# Patient Record
Sex: Female | Born: 1990
Health system: Southern US, Community
[De-identification: ages and names within clinical notes are randomized; demographics above are authoritative.]

## PROBLEM LIST (undated history)

## (undated) DIAGNOSIS — F329 Major depressive disorder, single episode, unspecified: Secondary | ICD-10-CM

## (undated) DIAGNOSIS — F32A Depression, unspecified: Secondary | ICD-10-CM

## (undated) DIAGNOSIS — J45909 Unspecified asthma, uncomplicated: Secondary | ICD-10-CM

---

## 2016-02-28 ENCOUNTER — Inpatient Hospital Stay (HOSPITAL_COMMUNITY)
Admission: EM | Admit: 2016-02-28 | Discharge: 2016-03-08 | DRG: 963 | Disposition: A | Discharge status: Rehabilitation Facility | Payer: Medicaid Other | Attending: General Surgery | Admitting: General Surgery

## 2016-02-28 ENCOUNTER — Emergency Department (HOSPITAL_COMMUNITY): Payer: Medicaid Other

## 2016-02-28 ENCOUNTER — Encounter (HOSPITAL_COMMUNITY): Payer: Self-pay

## 2016-02-28 DIAGNOSIS — I4892 Unspecified atrial flutter: Secondary | ICD-10-CM | POA: Diagnosis present

## 2016-02-28 DIAGNOSIS — E8770 Fluid overload, unspecified: Secondary | ICD-10-CM | POA: Diagnosis not present

## 2016-02-28 DIAGNOSIS — E872 Acidosis: Secondary | ICD-10-CM | POA: Diagnosis not present

## 2016-02-28 DIAGNOSIS — S069X9A Unspecified intracranial injury with loss of consciousness of unspecified duration, initial encounter: Secondary | ICD-10-CM | POA: Diagnosis present

## 2016-02-28 DIAGNOSIS — J1520 Pneumonia due to staphylococcus, unspecified: Secondary | ICD-10-CM | POA: Diagnosis not present

## 2016-02-28 DIAGNOSIS — S0211GA Other fracture of occiput, right side, initial encounter for closed fracture: Secondary | ICD-10-CM | POA: Diagnosis present

## 2016-02-28 DIAGNOSIS — J690 Pneumonitis due to inhalation of food and vomit: Secondary | ICD-10-CM | POA: Diagnosis present

## 2016-02-28 DIAGNOSIS — E876 Hypokalemia: Secondary | ICD-10-CM | POA: Diagnosis present

## 2016-02-28 DIAGNOSIS — S062X9A Diffuse traumatic brain injury with loss of consciousness of unspecified duration, initial encounter: Secondary | ICD-10-CM | POA: Diagnosis present

## 2016-02-28 DIAGNOSIS — S065X9A Traumatic subdural hemorrhage with loss of consciousness of unspecified duration, initial encounter: Secondary | ICD-10-CM | POA: Diagnosis present

## 2016-02-28 DIAGNOSIS — J9601 Acute respiratory failure with hypoxia: Secondary | ICD-10-CM | POA: Diagnosis present

## 2016-02-28 DIAGNOSIS — S069XAA Unspecified intracranial injury with loss of consciousness status unknown, initial encounter: Secondary | ICD-10-CM

## 2016-02-28 DIAGNOSIS — I7771 Dissection of carotid artery: Secondary | ICD-10-CM

## 2016-02-28 DIAGNOSIS — E1165 Type 2 diabetes mellitus with hyperglycemia: Secondary | ICD-10-CM | POA: Diagnosis present

## 2016-02-28 DIAGNOSIS — I483 Typical atrial flutter: Secondary | ICD-10-CM

## 2016-02-28 DIAGNOSIS — R402221 Coma scale, best verbal response, incomprehensible words, in the field [EMT or ambulance]: Secondary | ICD-10-CM | POA: Diagnosis present

## 2016-02-28 DIAGNOSIS — R402311 Coma scale, best motor response, none, in the field [EMT or ambulance]: Secondary | ICD-10-CM | POA: Diagnosis present

## 2016-02-28 DIAGNOSIS — D62 Acute posthemorrhagic anemia: Secondary | ICD-10-CM | POA: Diagnosis present

## 2016-02-28 DIAGNOSIS — R402111 Coma scale, eyes open, never, in the field [EMT or ambulance]: Secondary | ICD-10-CM | POA: Diagnosis present

## 2016-02-28 DIAGNOSIS — S27321A Contusion of lung, unilateral, initial encounter: Secondary | ICD-10-CM | POA: Diagnosis present

## 2016-02-28 DIAGNOSIS — S15102A Unspecified injury of left vertebral artery, initial encounter: Secondary | ICD-10-CM | POA: Diagnosis present

## 2016-02-28 DIAGNOSIS — S0219XA Other fracture of base of skull, initial encounter for closed fracture: Secondary | ICD-10-CM | POA: Diagnosis present

## 2016-02-28 DIAGNOSIS — J9602 Acute respiratory failure with hypercapnia: Secondary | ICD-10-CM | POA: Diagnosis present

## 2016-02-28 DIAGNOSIS — G960 Cerebrospinal fluid leak: Secondary | ICD-10-CM | POA: Diagnosis present

## 2016-02-28 DIAGNOSIS — H5702 Anisocoria: Secondary | ICD-10-CM

## 2016-02-28 DIAGNOSIS — H4902 Third [oculomotor] nerve palsy, left eye: Secondary | ICD-10-CM | POA: Diagnosis not present

## 2016-02-28 DIAGNOSIS — J189 Pneumonia, unspecified organism: Secondary | ICD-10-CM

## 2016-02-28 DIAGNOSIS — S0990XA Unspecified injury of head, initial encounter: Secondary | ICD-10-CM | POA: Diagnosis present

## 2016-02-28 DIAGNOSIS — M542 Cervicalgia: Secondary | ICD-10-CM

## 2016-02-28 DIAGNOSIS — J9690 Respiratory failure, unspecified, unspecified whether with hypoxia or hypercapnia: Secondary | ICD-10-CM

## 2016-02-28 HISTORY — DX: Depression, unspecified: F32.A

## 2016-02-28 HISTORY — DX: Unspecified asthma, uncomplicated: J45.909

## 2016-02-28 HISTORY — DX: Major depressive disorder, single episode, unspecified: F32.9

## 2016-02-28 LAB — I-STAT CHEM 8, ED
BUN: 11 mg/dL (ref 6–20)
Calcium, Ion: 1.07 mmol/L — ABNORMAL LOW (ref 1.13–1.30)
Chloride: 105 mmol/L (ref 101–111)
Creatinine, Ser: 0.8 mg/dL (ref 0.44–1.00)
Glucose, Bld: 174 mg/dL — ABNORMAL HIGH (ref 65–99)
HEMATOCRIT: 38 % (ref 36.0–46.0)
HEMOGLOBIN: 12.9 g/dL (ref 12.0–15.0)
POTASSIUM: 2.8 mmol/L — AB (ref 3.5–5.1)
SODIUM: 141 mmol/L (ref 135–145)
TCO2: 20 mmol/L (ref 0–100)

## 2016-02-28 LAB — COMPREHENSIVE METABOLIC PANEL
ALBUMIN: 4.3 g/dL (ref 3.5–5.0)
ALT: 16 U/L (ref 14–54)
AST: 26 U/L (ref 15–41)
Alkaline Phosphatase: 75 U/L (ref 38–126)
Anion gap: 9 (ref 5–15)
BUN: 10 mg/dL (ref 6–20)
CHLORIDE: 108 mmol/L (ref 101–111)
CO2: 18 mmol/L — AB (ref 22–32)
CREATININE: 0.87 mg/dL (ref 0.44–1.00)
Calcium: 8.8 mg/dL — ABNORMAL LOW (ref 8.9–10.3)
GFR calc Af Amer: >60 mL/min (ref >60)
GFR calc non Af Amer: >60 mL/min (ref >60)
GLUCOSE: 174 mg/dL — AB (ref 65–99)
POTASSIUM: 2.8 mmol/L — AB (ref 3.5–5.1)
SODIUM: 135 mmol/L (ref 135–145)
Total Bilirubin: 0.3 mg/dL (ref 0.3–1.2)
Total Protein: 6.7 g/dL (ref 6.5–8.1)

## 2016-02-28 LAB — CBC
HEMATOCRIT: 40.4 % (ref 36.0–46.0)
HEMATOCRIT: 37.8 % (ref 36.0–46.0)
Hemoglobin: 13.6 g/dL (ref 12.0–15.0)
Hemoglobin: 13.2 g/dL (ref 12.0–15.0)
MCH: 27.6 pg (ref 26.0–34.0)
MCH: 28.7 pg (ref 26.0–34.0)
MCHC: 33.7 g/dL (ref 30.0–36.0)
MCHC: 34.9 g/dL (ref 30.0–36.0)
MCV: 82.1 fL (ref 78.0–100.0)
MCV: 82.2 fL (ref 78.0–100.0)
Platelets: 391 10*3/uL (ref 150–400)
Platelets: 245 10*3/uL (ref 150–400)
RBC: 4.92 MIL/uL (ref 3.87–5.11)
RBC: 4.6 MIL/uL (ref 3.87–5.11)
RDW: 12.9 % (ref 11.5–15.5)
RDW: 13 % (ref 11.5–15.5)
WBC: 15.2 10*3/uL — AB (ref 4.0–10.5)
WBC: 16.5 10*3/uL — AB (ref 4.0–10.5)

## 2016-02-28 LAB — PREPARE FRESH FROZEN PLASMA
UNIT DIVISION: 0
Unit division: 0

## 2016-02-28 LAB — BASIC METABOLIC PANEL
Anion gap: 10 (ref 5–15)
BUN: 9 mg/dL (ref 6–20)
CHLORIDE: 106 mmol/L (ref 101–111)
CO2: 20 mmol/L — ABNORMAL LOW (ref 22–32)
Calcium: 8.6 mg/dL — ABNORMAL LOW (ref 8.9–10.3)
Creatinine, Ser: 0.77 mg/dL (ref 0.44–1.00)
GFR calc non Af Amer: >60 mL/min (ref >60)
Glucose, Bld: 120 mg/dL — ABNORMAL HIGH (ref 65–99)
POTASSIUM: 3 mmol/L — AB (ref 3.5–5.1)
SODIUM: 136 mmol/L (ref 135–145)

## 2016-02-28 LAB — ABO/RH: ABO/RH(D): O POS

## 2016-02-28 LAB — ETHANOL: Alcohol, Ethyl (B): <5 mg/dL (ref <5)

## 2016-02-28 LAB — PROTIME-INR
INR: 1.14
Prothrombin Time: 14.7 seconds (ref 11.4–15.2)

## 2016-02-28 LAB — I-STAT CG4 LACTIC ACID, ED: Lactic Acid, Venous: 4.33 mmol/L (ref 0.5–1.9)

## 2016-02-28 LAB — TRIGLYCERIDES: Triglycerides: 88 mg/dL (ref <150)

## 2016-02-28 LAB — CDS SEROLOGY

## 2016-02-28 LAB — CBG MONITORING, ED: Glucose-Capillary: 173 mg/dL — ABNORMAL HIGH (ref 65–99)

## 2016-02-28 MED ORDER — IOPAMIDOL (ISOVUE-300) INJECTION 61%
INTRAVENOUS | Status: AC
  Filled 2016-02-28: qty 100

## 2016-02-28 MED ORDER — ANTISEPTIC ORAL RINSE SOLUTION (CORINZ): 7.0000 mL | Freq: Four times a day (QID) | OROMUCOSAL | Status: DC

## 2016-02-28 MED ORDER — SODIUM CHLORIDE 0.9 % IV SOLN
25.0000 ug/h | INTRAVENOUS | Status: DC
  Administered 2016-02-28 – 2016-02-29 (×2): 200 ug/h via INTRAVENOUS
  Administered 2016-02-29: 300 ug/h via INTRAVENOUS
  Administered 2016-03-01: 200 ug/h via INTRAVENOUS
  Administered 2016-03-03: 25 ug/h via INTRAVENOUS
  Administered 2016-03-06: 50 ug/h via INTRAVENOUS
  Filled 2016-02-28 (×8): qty 50

## 2016-02-28 MED ORDER — ONDANSETRON HCL 4 MG/2ML IJ SOLN
4.0000 mg | Freq: Four times a day (QID) | INTRAMUSCULAR | Status: DC | PRN
  Administered 2016-03-06 – 2016-03-07 (×2): 4 mg via INTRAVENOUS
  Filled 2016-02-28 (×2): qty 2

## 2016-02-28 MED ORDER — DOCUSATE SODIUM 50 MG/5ML PO LIQD
100.0000 mg | Freq: Two times a day (BID) | ORAL | Status: DC | PRN
  Administered 2016-03-02: 100 mg
  Filled 2016-02-28: qty 10

## 2016-02-28 MED ORDER — ACETAMINOPHEN 325 MG PO TABS
650.0000 mg | ORAL_TABLET | ORAL | Status: DC | PRN
  Administered 2016-03-02 – 2016-03-08 (×4): 650 mg via ORAL
  Filled 2016-02-28 (×4): qty 2

## 2016-02-28 MED ORDER — CHLORHEXIDINE GLUCONATE 0.12% ORAL RINSE (MEDLINE KIT)
15.0000 mL | Freq: Two times a day (BID) | OROMUCOSAL | Status: DC
  Administered 2016-02-28: 15 mL via OROMUCOSAL

## 2016-02-28 MED ORDER — FENTANYL CITRATE (PF) 100 MCG/2ML IJ SOLN: 50.0000 ug | Freq: Once | INTRAMUSCULAR | Status: DC

## 2016-02-28 MED ORDER — SODIUM CHLORIDE 0.9 % IV SOLN
25.0000 ug/h | INTRAVENOUS | Status: DC
  Administered 2016-02-28: 50 ug/h via INTRAVENOUS
  Filled 2016-02-28: qty 50

## 2016-02-28 MED ORDER — POTASSIUM CHLORIDE 10 MEQ/100ML IV SOLN
10.0000 meq | INTRAVENOUS | Status: AC
  Administered 2016-02-28 – 2016-02-29 (×2): 10 meq via INTRAVENOUS
  Filled 2016-02-28 (×2): qty 100

## 2016-02-28 MED ORDER — KCL IN DEXTROSE-NACL 20-5-0.45 MEQ/L-%-% IV SOLN
INTRAVENOUS | Status: DC
  Administered 2016-02-28 – 2016-03-01 (×2): via INTRAVENOUS
  Administered 2016-03-03: 100 mL/h via INTRAVENOUS
  Administered 2016-03-03: 20:00:00 via INTRAVENOUS
  Administered 2016-03-04: 100 mL/h via INTRAVENOUS
  Administered 2016-03-04 – 2016-03-05 (×3): via INTRAVENOUS
  Filled 2016-02-28 (×13): qty 1000

## 2016-02-28 MED ORDER — SUCCINYLCHOLINE CHLORIDE 20 MG/ML IJ SOLN
INTRAMUSCULAR | Status: AC | PRN
  Administered 2016-02-28: 150 mg via INTRAVENOUS

## 2016-02-28 MED ORDER — FENTANYL CITRATE (PF) 100 MCG/2ML IJ SOLN
50.0000 ug | Freq: Once | INTRAMUSCULAR | Status: AC
  Administered 2016-02-28: 50 ug via INTRAVENOUS

## 2016-02-28 MED ORDER — DOCUSATE SODIUM 100 MG PO CAPS
100.0000 mg | ORAL_CAPSULE | Freq: Two times a day (BID) | ORAL | Status: DC
  Administered 2016-03-03 – 2016-03-08 (×6): 100 mg via ORAL
  Filled 2016-02-28 (×6): qty 1

## 2016-02-28 MED ORDER — FENTANYL BOLUS VIA INFUSION
50.0000 ug | INTRAVENOUS | Status: DC | PRN
  Filled 2016-02-28: qty 50

## 2016-02-28 MED ORDER — MIDAZOLAM HCL 2 MG/2ML IJ SOLN
INTRAMUSCULAR | Status: AC
  Filled 2016-02-28: qty 4

## 2016-02-28 MED ORDER — PROPOFOL 1000 MG/100ML IV EMUL
0.0000 ug/kg/min | INTRAVENOUS | Status: DC
  Administered 2016-02-28: 50 ug/kg/min via INTRAVENOUS

## 2016-02-28 MED ORDER — PROPOFOL 10 MG/ML IV BOLUS
INTRAVENOUS | Status: DC | PRN
  Administered 2016-02-28: 50 mg via INTRAVENOUS

## 2016-02-28 MED ORDER — ONDANSETRON HCL 4 MG PO TABS: 4.0000 mg | ORAL_TABLET | Freq: Four times a day (QID) | ORAL | Status: DC | PRN

## 2016-02-28 MED ORDER — PROPOFOL 1000 MG/100ML IV EMUL
0.0000 ug/kg/min | INTRAVENOUS | Status: DC
  Administered 2016-02-28 – 2016-02-29 (×5): 50 ug/kg/min via INTRAVENOUS
  Administered 2016-02-29: 40 ug/kg/min via INTRAVENOUS
  Administered 2016-03-01 (×3): 50 ug/kg/min via INTRAVENOUS
  Administered 2016-03-01: 45 ug/kg/min via INTRAVENOUS
  Administered 2016-03-02 (×2): 40 ug/kg/min via INTRAVENOUS
  Administered 2016-03-02: 45 ug/kg/min via INTRAVENOUS
  Administered 2016-03-03: 10 ug/kg/min via INTRAVENOUS
  Administered 2016-03-03: 30 ug/kg/min via INTRAVENOUS
  Administered 2016-03-03: 25 ug/kg/min via INTRAVENOUS
  Administered 2016-03-04 (×3): 30 ug/kg/min via INTRAVENOUS
  Administered 2016-03-05: 20 ug/kg/min via INTRAVENOUS
  Administered 2016-03-05: 40 ug/kg/min via INTRAVENOUS
  Administered 2016-03-05: 20 ug/kg/min via INTRAVENOUS
  Administered 2016-03-06: 30 ug/kg/min via INTRAVENOUS
  Administered 2016-03-06: 20 ug/kg/min via INTRAVENOUS
  Filled 2016-02-28 (×27): qty 100

## 2016-02-28 MED ORDER — PROPOFOL 1000 MG/100ML IV EMUL
INTRAVENOUS | Status: DC | PRN
  Administered 2016-02-28: 10 ug/kg/min via INTRAVENOUS

## 2016-02-28 MED ORDER — MORPHINE SULFATE (PF) 2 MG/ML IV SOLN
2.0000 mg | INTRAVENOUS | Status: DC | PRN
  Administered 2016-03-06 – 2016-03-07 (×4): 2 mg via INTRAVENOUS
  Filled 2016-02-28 (×4): qty 1

## 2016-02-28 MED ORDER — FENTANYL CITRATE (PF) 100 MCG/2ML IJ SOLN
INTRAMUSCULAR | Status: AC
  Filled 2016-02-28: qty 2

## 2016-02-28 MED ORDER — ETOMIDATE 2 MG/ML IV SOLN
INTRAVENOUS | Status: DC | PRN
  Administered 2016-02-28: 20 mg via INTRAVENOUS
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
## 2016-02-28 NOTE — ED Notes (Addendum)
Trauma at bedside [facility]
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
## 2016-02-29 ENCOUNTER — Inpatient Hospital Stay (HOSPITAL_COMMUNITY): Payer: Medicaid Other

## 2016-02-29 ENCOUNTER — Encounter (HOSPITAL_COMMUNITY): Payer: Self-pay | Admitting: Radiology

## 2016-02-29 LAB — TYPE AND SCREEN
ABO/RH(D): O POS
Antibody Screen: NEGATIVE
Unit division: 0
Unit division: 0

## 2016-02-29 LAB — CBC
HCT: 35.1 % — ABNORMAL LOW (ref 36.0–46.0)
HEMOGLOBIN: 12.5 g/dL (ref 12.0–15.0)
MCH: 29 pg (ref 26.0–34.0)
MCHC: 35.6 g/dL (ref 30.0–36.0)
MCV: 81.4 fL (ref 78.0–100.0)
PLATELETS: 199 10*3/uL (ref 150–400)
RBC: 4.31 MIL/uL (ref 3.87–5.11)
RDW: 13.1 % (ref 11.5–15.5)
WBC: 19 10*3/uL — AB (ref 4.0–10.5)

## 2016-02-29 LAB — BASIC METABOLIC PANEL
ANION GAP: 7 (ref 5–15)
BUN: 8 mg/dL (ref 6–20)
CALCIUM: 8.7 mg/dL — AB (ref 8.9–10.3)
CO2: 24 mmol/L (ref 22–32)
CREATININE: 0.69 mg/dL (ref 0.44–1.00)
Chloride: 106 mmol/L (ref 101–111)
Glucose, Bld: 114 mg/dL — ABNORMAL HIGH (ref 65–99)
Potassium: 3.8 mmol/L (ref 3.5–5.1)
SODIUM: 137 mmol/L (ref 135–145)

## 2016-02-29 LAB — MAGNESIUM
Magnesium: 1.7 mg/dL (ref 1.7–2.4)
Magnesium: 1.7 mg/dL (ref 1.7–2.4)

## 2016-02-29 LAB — POCT I-STAT 3, ART BLOOD GAS (G3+)
Bicarbonate: 25.9 meq/L — ABNORMAL HIGH (ref 20.0–24.0)
O2 SAT: 99 %
PCO2 ART: 46.1 mmHg — AB (ref 35.0–45.0)
PH ART: 7.359 (ref 7.350–7.450)
Patient temperature: 37.4
TCO2: 27 mmol/L (ref 0–100)
pO2, Arterial: 135 mmHg — ABNORMAL HIGH (ref 80.0–100.0)

## 2016-02-29 LAB — GLUCOSE, CAPILLARY: GLUCOSE-CAPILLARY: 141 mg/dL — AB (ref 65–99)

## 2016-02-29 LAB — BLOOD PRODUCT ORDER (VERBAL) VERIFICATION

## 2016-02-29 LAB — PHOSPHORUS
PHOSPHORUS: 3.2 mg/dL (ref 2.5–4.6)
PHOSPHORUS: 2.7 mg/dL (ref 2.5–4.6)

## 2016-02-29 MED ORDER — SODIUM CHLORIDE 0.9 % IV SOLN: INTRAVENOUS | Status: DC | PRN

## 2016-02-29 MED ORDER — ADULT MULTIVITAMIN LIQUID CH
15.0000 mL | Freq: Every day | ORAL | Status: DC
  Administered 2016-02-29 – 2016-03-05 (×6): 15 mL
  Filled 2016-02-29 (×8): qty 15

## 2016-02-29 MED ORDER — IOPAMIDOL (ISOVUE-370) INJECTION 76%
INTRAVENOUS | Status: AC
  Administered 2016-02-29: 50 mL
  Filled 2016-02-29: qty 50

## 2016-02-29 MED ORDER — SELENIUM 50 MCG PO TABS
200.0000 ug | ORAL_TABLET | Freq: Every day | ORAL | Status: AC
  Administered 2016-02-29 – 2016-03-05 (×6): 200 ug
  Filled 2016-02-29 (×7): qty 4

## 2016-02-29 MED ORDER — SODIUM CHLORIDE 0.9% FLUSH: 10.0000 mL | INTRAVENOUS | Status: DC | PRN

## 2016-02-29 MED ORDER — VITAL HIGH PROTEIN PO LIQD: 1000.0000 mL | ORAL | Status: DC

## 2016-02-29 MED ORDER — PIVOT 1.5 CAL PO LIQD
1000.0000 mL | ORAL | Status: DC
  Administered 2016-02-29 – 2016-03-04 (×5): 1000 mL

## 2016-02-29 MED ORDER — PRO-STAT SUGAR FREE PO LIQD
30.0000 mL | Freq: Two times a day (BID) | ORAL | Status: DC
  Filled 2016-02-29: qty 30

## 2016-02-29 MED ORDER — VITAMIN C 500 MG PO TABS
1000.0000 mg | ORAL_TABLET | Freq: Three times a day (TID) | ORAL | Status: AC
  Administered 2016-02-29 – 2016-03-06 (×19): 1000 mg
  Filled 2016-02-29 (×19): qty 2

## 2016-02-29 MED ORDER — CHLORHEXIDINE GLUCONATE 0.12% ORAL RINSE (MEDLINE KIT)
15.0000 mL | Freq: Two times a day (BID) | OROMUCOSAL | Status: DC
  Administered 2016-02-29 – 2016-03-06 (×13): 15 mL via OROMUCOSAL

## 2016-02-29 MED ORDER — PRO-STAT SUGAR FREE PO LIQD
60.0000 mL | Freq: Three times a day (TID) | ORAL | Status: DC
  Administered 2016-02-29 – 2016-03-05 (×15): 60 mL
  Filled 2016-02-29 (×15): qty 60

## 2016-02-29 MED ORDER — ANTISEPTIC ORAL RINSE SOLUTION (CORINZ)
7.0000 mL | OROMUCOSAL | Status: DC
  Administered 2016-02-29 – 2016-03-06 (×64): 7 mL via OROMUCOSAL

## 2016-02-29 MED ORDER — ASPIRIN 300 MG RE SUPP
300.0000 mg | Freq: Every day | RECTAL | Status: DC
  Administered 2016-02-29: 300 mg via RECTAL
  Filled 2016-02-29: qty 1

## 2016-02-29 NOTE — Progress Notes (Signed)
Two RT's attempted Aline insertion x's two in left and right radial artery with no success. RN aware. ABG drawn.
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
## 2016-02-29 NOTE — Progress Notes (Signed)
   02/29/16 0000  Clinical Encounter Type  Visited With Family  Visit Type ED;Spiritual support  Referral From Nurse  Spiritual Encounters  Spiritual Needs Prayer;Emotional  Stress Factors  Family Stress Factors Health changes;Lack of knowledge  Chaplain stayed with family for several hours, helping communications between [facility] ED and ED, procuring information briefings, and ensuring the family was provided all support possible, logistically and emotionally. Suzane Vanderweide, Chaplain
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
## 2016-02-29 NOTE — Progress Notes (Signed)
Peripherally Inserted Central Catheter/Midline Placement  The IV Nurse has discussed with the patient and/or persons authorized to consent for the patient, the purpose of this procedure and the potential benefits and risks involved with this procedure.  The benefits include less needle sticks, lab draws from the catheter and patient may be discharged home with the catheter.  Risks include, but not limited to, infection, bleeding, blood clot (thrombus formation), and puncture of an artery; nerve damage and irregular heat beat.  Alternatives to this procedure were also discussed.  PICC/Midline Placement Documentation  PICC Double Lumen 02/29/16 PICC Right Brachial 47 cm 4 cm (Active)  Indication for Insertion or Continuance of Line Limited venous access - need for IV therapy >5 days (PICC only) 02/29/2016  2:55 PM  Exposed Catheter (cm) 4 cm 02/29/2016  2:55 PM  Dressing Change Due 03/07/16 02/29/2016  2:55 PM    Consent signed by patients uncle. Vergie Living M 02/29/2016, 2:57 PM
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
## 2016-03-01 ENCOUNTER — Inpatient Hospital Stay (HOSPITAL_COMMUNITY): Payer: Medicaid Other

## 2016-03-01 LAB — CBC
HEMATOCRIT: 30.6 % — AB (ref 36.0–46.0)
HEMOGLOBIN: 10.3 g/dL — AB (ref 12.0–15.0)
MCH: 28.1 pg (ref 26.0–34.0)
MCHC: 33.7 g/dL (ref 30.0–36.0)
MCV: 83.6 fL (ref 78.0–100.0)
Platelets: 164 10*3/uL (ref 150–400)
RBC: 3.66 MIL/uL — ABNORMAL LOW (ref 3.87–5.11)
RDW: 13.3 % (ref 11.5–15.5)
WBC: 12.8 10*3/uL — AB (ref 4.0–10.5)

## 2016-03-01 LAB — GLUCOSE, CAPILLARY
GLUCOSE-CAPILLARY: 104 mg/dL — AB (ref 65–99)
GLUCOSE-CAPILLARY: 111 mg/dL — AB (ref 65–99)
GLUCOSE-CAPILLARY: 91 mg/dL (ref 65–99)
Glucose-Capillary: 121 mg/dL — ABNORMAL HIGH (ref 65–99)
Glucose-Capillary: 88 mg/dL (ref 65–99)
Glucose-Capillary: 95 mg/dL (ref 65–99)

## 2016-03-01 LAB — MAGNESIUM
MAGNESIUM: 1.7 mg/dL (ref 1.7–2.4)
Magnesium: 1.8 mg/dL (ref 1.7–2.4)
Magnesium: 1.8 mg/dL (ref 1.7–2.4)

## 2016-03-01 LAB — POCT I-STAT 3, ART BLOOD GAS (G3+)
ACID-BASE DEFICIT: 1 mmol/L (ref 0.0–2.0)
Bicarbonate: 22.3 meq/L (ref 20.0–24.0)
O2 Saturation: 100 %
PCO2 ART: 30.9 mmHg — AB (ref 35.0–45.0)
PH ART: 7.469 — AB (ref 7.350–7.450)
Patient temperature: 99.3
TCO2: 23 mmol/L (ref 0–100)
pO2, Arterial: 160 mmHg — ABNORMAL HIGH (ref 80.0–100.0)

## 2016-03-01 LAB — BASIC METABOLIC PANEL
ANION GAP: 6 (ref 5–15)
BUN: 8 mg/dL (ref 6–20)
CHLORIDE: 106 mmol/L (ref 101–111)
CO2: 24 mmol/L (ref 22–32)
Calcium: 8.5 mg/dL — ABNORMAL LOW (ref 8.9–10.3)
Creatinine, Ser: 0.62 mg/dL (ref 0.44–1.00)
GFR calc non Af Amer: >60 mL/min (ref >60)
Glucose, Bld: 128 mg/dL — ABNORMAL HIGH (ref 65–99)
POTASSIUM: 3.4 mmol/L — AB (ref 3.5–5.1)
SODIUM: 136 mmol/L (ref 135–145)

## 2016-03-01 LAB — PHOSPHORUS
PHOSPHORUS: 2.6 mg/dL (ref 2.5–4.6)
Phosphorus: 2.9 mg/dL (ref 2.5–4.6)

## 2016-03-01 MED ORDER — QUETIAPINE FUMARATE 100 MG PO TABS
50.0000 mg | ORAL_TABLET | Freq: Two times a day (BID) | ORAL | Status: DC
Start: 2016-03-01 — End: 2016-03-05
  Administered 2016-03-01 – 2016-03-04 (×7): 50 mg
  Filled 2016-03-01 (×7): qty 1

## 2016-03-01 MED ORDER — CLONAZEPAM 0.5 MG PO TABS
0.2500 mg | ORAL_TABLET | Freq: Two times a day (BID) | ORAL | Status: DC
  Administered 2016-03-01: 0.25 mg
  Filled 2016-03-01: qty 1

## 2016-03-01 MED ORDER — QUETIAPINE FUMARATE 25 MG PO TABS
25.0000 mg | ORAL_TABLET | Freq: Two times a day (BID) | ORAL | Status: DC
  Administered 2016-03-01: 25 mg
  Filled 2016-03-01 (×2): qty 1

## 2016-03-01 MED ORDER — ASPIRIN 325 MG PO TABS
ORAL_TABLET | ORAL | Status: AC
  Filled 2016-03-01: qty 1

## 2016-03-01 MED ORDER — CLONAZEPAM 0.1 MG/ML ORAL SUSPENSION: 0.2500 mg | Freq: Two times a day (BID) | ORAL | Status: DC

## 2016-03-01 MED ORDER — PANTOPRAZOLE SODIUM 40 MG PO PACK
40.0000 mg | PACK | Freq: Every day | ORAL | Status: DC
  Administered 2016-03-01 – 2016-03-05 (×5): 40 mg
  Filled 2016-03-01 (×4): qty 20

## 2016-03-01 MED ORDER — ASPIRIN 325 MG PO TABS
325.0000 mg | ORAL_TABLET | Freq: Every day | ORAL | Status: DC
  Administered 2016-03-01 – 2016-03-08 (×8): 325 mg via ORAL
  Filled 2016-03-01 (×7): qty 1

## 2016-03-01 MED ORDER — CLONAZEPAM 0.5 MG PO TABS
0.5000 mg | ORAL_TABLET | Freq: Two times a day (BID) | ORAL | Status: DC
  Administered 2016-03-01 – 2016-03-04 (×7): 0.5 mg
  Filled 2016-03-01 (×7): qty 1

## 2016-03-01 MED ORDER — POTASSIUM CHLORIDE 20 MEQ/15ML (10%) PO SOLN
20.0000 meq | Freq: Two times a day (BID) | ORAL | Status: AC
  Administered 2016-03-01 (×2): 20 meq via ORAL
  Filled 2016-03-01 (×2): qty 15
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
## 2016-03-01 NOTE — Progress Notes (Signed)
Patient ID: Theresa Henderson, female   DOB: [birth year], 25 y.o.   MRN: [id number] BP (!) 94/52   Pulse 75   Temp 98.6 F (37 C) (Core (Comment))   Resp 16   Ht 6\' 1"  (1.854 m)   Wt 94.1 kg (207 lb 7.3 oz)   SpO2 100%   BMI 27.37 kg/m  Currently sedated, following commands earlier. Moving all extremities Attempted to wean, became agitated Given klonopin for sedation.  Improving On aspirin

## 2016-03-01 NOTE — Progress Notes (Signed)
Patient ID: Theresa Henderson, female   DOB: [birth year], 25 y.o.   MRN: [id number] I met with her mother and cousin at the bedside and updated them on the plan of care. Georganna Skeans, MD, MPH, FACS Trauma: [phone] General Surgery: [phone]

## 2016-03-01 NOTE — Progress Notes (Signed)
Patient has had multiple 5-6 beat runs of vtach tonight while sedated. Called Dr Dwain Sarna, order received for stat BMET and mag. Will continue to monitor.

## 2016-03-02 ENCOUNTER — Encounter (HOSPITAL_COMMUNITY): Payer: Self-pay | Admitting: Cardiology

## 2016-03-02 ENCOUNTER — Inpatient Hospital Stay (HOSPITAL_COMMUNITY): Payer: Medicaid Other

## 2016-03-02 DIAGNOSIS — J9602 Acute respiratory failure with hypercapnia: Secondary | ICD-10-CM

## 2016-03-02 DIAGNOSIS — J9601 Acute respiratory failure with hypoxia: Secondary | ICD-10-CM

## 2016-03-02 DIAGNOSIS — I483 Typical atrial flutter: Secondary | ICD-10-CM

## 2016-03-02 LAB — GLUCOSE, CAPILLARY
GLUCOSE-CAPILLARY: 146 mg/dL — AB (ref 65–99)
GLUCOSE-CAPILLARY: 187 mg/dL — AB (ref 65–99)
Glucose-Capillary: 98 mg/dL (ref 65–99)
Glucose-Capillary: 150 mg/dL — ABNORMAL HIGH (ref 65–99)
Glucose-Capillary: 126 mg/dL — ABNORMAL HIGH (ref 65–99)
Glucose-Capillary: 124 mg/dL — ABNORMAL HIGH (ref 65–99)

## 2016-03-02 LAB — CBC
HEMATOCRIT: 33.6 % — AB (ref 36.0–46.0)
HEMOGLOBIN: 10.9 g/dL — AB (ref 12.0–15.0)
MCH: 27.6 pg (ref 26.0–34.0)
MCHC: 32.4 g/dL (ref 30.0–36.0)
MCV: 85.1 fL (ref 78.0–100.0)
PLATELETS: 163 10*3/uL (ref 150–400)
RBC: 3.95 MIL/uL (ref 3.87–5.11)
RDW: 13.5 % (ref 11.5–15.5)
WBC: 14.7 10*3/uL — AB (ref 4.0–10.5)

## 2016-03-02 LAB — BASIC METABOLIC PANEL
Anion gap: 7 (ref 5–15)
BUN: 10 mg/dL (ref 6–20)
CALCIUM: 8.4 mg/dL — AB (ref 8.9–10.3)
CO2: 23 mmol/L (ref 22–32)
CREATININE: 0.5 mg/dL (ref 0.44–1.00)
Chloride: 107 mmol/L (ref 101–111)
GFR calc Af Amer: >60 mL/min (ref >60)
GLUCOSE: 137 mg/dL — AB (ref 65–99)
Potassium: 4.2 mmol/L (ref 3.5–5.1)
Sodium: 137 mmol/L (ref 135–145)

## 2016-03-02 LAB — TRIGLYCERIDES: TRIGLYCERIDES: 72 mg/dL (ref <150)

## 2016-03-02 LAB — BLOOD GAS, ARTERIAL
Acid-base deficit: 2.5 mmol/L — ABNORMAL HIGH (ref 0.0–2.0)
Bicarbonate: 24.9 mEq/L — ABNORMAL HIGH (ref 20.0–24.0)
DRAWN BY: 275531
FIO2: 0.3
O2 SAT: 92.4 %
PCO2 ART: 69.2 mmHg — AB (ref 35.0–45.0)
PEEP: 5 cmH2O
Patient temperature: 98.6
Pressure support: 10 cmH2O
TCO2: 27 mmol/L (ref 0–100)
pH, Arterial: 7.181 — CL (ref 7.350–7.450)
pO2, Arterial: 74.6 mmHg — ABNORMAL LOW (ref 80.0–100.0)

## 2016-03-02 LAB — TROPONIN I: Troponin I: 0.05 ng/mL (ref <0.03)

## 2016-03-02 MED ORDER — METOPROLOL TARTRATE 25 MG/10 ML ORAL SUSPENSION
12.5000 mg | Freq: Four times a day (QID) | ORAL | Status: DC
  Administered 2016-03-02 – 2016-03-06 (×16): 12.5 mg via ORAL
  Filled 2016-03-02 (×16): qty 10

## 2016-03-02 MED ORDER — DEXTROSE 5 % IV SOLN
1.0000 g | Freq: Three times a day (TID) | INTRAVENOUS | Status: DC
  Administered 2016-03-02 – 2016-03-04 (×7): 1 g via INTRAVENOUS
  Filled 2016-03-02 (×9): qty 1

## 2016-03-02 MED ORDER — DEXTROSE 5 % IV SOLN
1.0000 g | Freq: Two times a day (BID) | INTRAVENOUS | Status: DC
  Filled 2016-03-02 (×2): qty 1

## 2016-03-02 MED ORDER — METOPROLOL TARTRATE 5 MG/5ML IV SOLN
10.0000 mg | Freq: Once | INTRAVENOUS | Status: AC
Start: 2016-03-02 — End: 2016-03-02
  Administered 2016-03-02: 10 mg via INTRAVENOUS

## 2016-03-02 MED ORDER — VANCOMYCIN HCL 10 G IV SOLR
1500.0000 mg | Freq: Once | INTRAVENOUS | Status: AC
  Administered 2016-03-02: 1500 mg via INTRAVENOUS
  Filled 2016-03-02: qty 1500

## 2016-03-02 MED ORDER — METOPROLOL TARTRATE 5 MG/5ML IV SOLN
INTRAVENOUS | Status: AC
  Filled 2016-03-02: qty 20

## 2016-03-02 MED ORDER — VANCOMYCIN HCL IN DEXTROSE 1-5 GM/200ML-% IV SOLN
1000.0000 mg | Freq: Three times a day (TID) | INTRAVENOUS | Status: DC
  Administered 2016-03-02 – 2016-03-04 (×6): 1000 mg via INTRAVENOUS
  Filled 2016-03-02 (×8): qty 200

## 2016-03-02 NOTE — Progress Notes (Signed)
Pt's LMP is 02/29/16. Pt is currently having her menses.

## 2016-03-02 NOTE — Progress Notes (Signed)
Patient ID: Theresa Henderson, female   DOB: 1991-01-14, 25 y.o.   MRN: [id number] I met with her mother at the bedside and updated her and answered her questions. Georganna Skeans, MD, MPH, FACS Trauma: [phone] General Surgery: [phone]
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
## 2016-03-02 NOTE — Progress Notes (Signed)
Dr. Janee Morn notified of critical ABG results and change in rhythm. EKG ordered as well as placing patient back on full support. Orders carried out. Results of EKG shown to [place]. Troponin ordered and will consult cardiology. Will continue to monitor closely.

## 2016-03-02 NOTE — Progress Notes (Signed)
Patient ID: Theresa Henderson, female   DOB: 11/21/1990, 25 y.o.   MRN: [id number] BP 107/63   Pulse 76   Temp 97.9 F (36.6 C) (Core (Comment))   Resp 16   Ht 6\' 1"  (1.854 m)   Wt 96.9 kg (213 lb 10 oz)   LMP 02/29/2016 (Exact Date)   SpO2 100%   BMI 28.18 kg/m  SedAted, has followed commands Moves her extremities Perrl, full eom Still working on respiratory function Improving.

## 2016-03-02 NOTE — Progress Notes (Signed)
Patient ID: Theresa Henderson, female   DOB: April 20, 1991, 25 y.o.   MRN: [id number] EKG with inferolateral ischemia, possible block. I consulted cardiology for possible STEMI. Troponin ordered. BP OK and endtidal CO2 better back on the vent. Violeta Gelinas, MD, MPH, FACS Trauma: [phone] General Surgery: [phone]

## 2016-03-02 NOTE — Progress Notes (Signed)
Dr. Janee Morn made aware of critical troponin of 0.05. No new orders at this time.

## 2016-03-02 NOTE — Progress Notes (Signed)
EKG CRITICAL VALUE     12 lead EKG performed.  Critical value noted.  Dr. Tresa Endo, MD notified.   Barb Merino, CCT 03/02/2016 10:56 AM

## 2016-03-02 NOTE — Progress Notes (Signed)
Dr. Janee Morn notified of ET CO2 of 69 while weaning. Order for ABG stat. Order carried out. Will continue to monitor closely.

## 2016-03-02 NOTE — Progress Notes (Signed)
Patient ID: Theresa Henderson, female   DOB: 1990/09/22, 25 y.o.   MRN: [id number] Endtidal CO2 up with weaning - confirmed resp acidosis on ABG. Place back on full support. ABG at 1130. Will check 12 lead as some ST changes on monitor. Violeta Gelinas, MD, MPH, FACS Trauma: [phone] General Surgery: [phone]
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
## 2016-03-03 ENCOUNTER — Inpatient Hospital Stay (HOSPITAL_COMMUNITY): Payer: Medicaid Other

## 2016-03-03 ENCOUNTER — Other Ambulatory Visit (HOSPITAL_COMMUNITY): Payer: Self-pay

## 2016-03-03 DIAGNOSIS — R9431 Abnormal electrocardiogram [ECG] [EKG]: Secondary | ICD-10-CM

## 2016-03-03 LAB — BASIC METABOLIC PANEL
Anion gap: 5 (ref 5–15)
BUN: 14 mg/dL (ref 6–20)
CALCIUM: 8.1 mg/dL — AB (ref 8.9–10.3)
CO2: 27 mmol/L (ref 22–32)
CREATININE: 0.52 mg/dL (ref 0.44–1.00)
Chloride: 106 mmol/L (ref 101–111)
GFR calc Af Amer: >60 mL/min (ref >60)
GLUCOSE: 146 mg/dL — AB (ref 65–99)
Potassium: 4.1 mmol/L (ref 3.5–5.1)
Sodium: 138 mmol/L (ref 135–145)

## 2016-03-03 LAB — CBC
HEMATOCRIT: 43.3 % (ref 36.0–46.0)
Hemoglobin: 14.1 g/dL (ref 12.0–15.0)
MCH: 27.8 pg (ref 26.0–34.0)
MCHC: 32.6 g/dL (ref 30.0–36.0)
MCV: 85.2 fL (ref 78.0–100.0)
PLATELETS: 130 10*3/uL — AB (ref 150–400)
RBC: 5.08 MIL/uL (ref 3.87–5.11)
RDW: 13.4 % (ref 11.5–15.5)
WBC: 7.1 10*3/uL (ref 4.0–10.5)

## 2016-03-03 LAB — BLOOD GAS, ARTERIAL
Acid-Base Excess: 1.2 mmol/L (ref 0.0–2.0)
BICARBONATE: 27.6 meq/L — AB (ref 20.0–24.0)
Drawn by: 276051
FIO2: 0.3
O2 SAT: 96.5 %
PATIENT TEMPERATURE: 98.6
PCO2 ART: 64.1 mmHg — AB (ref 35.0–45.0)
PEEP/CPAP: 5 cmH2O
PO2 ART: 90.3 mmHg (ref 80.0–100.0)
Pressure support: 10 cmH2O
TCO2: 29.6 mmol/L (ref 0–100)
pH, Arterial: 7.257 — ABNORMAL LOW (ref 7.350–7.450)

## 2016-03-03 LAB — URINE CULTURE: Culture: NO GROWTH

## 2016-03-03 LAB — GLUCOSE, CAPILLARY
GLUCOSE-CAPILLARY: 160 mg/dL — AB (ref 65–99)
GLUCOSE-CAPILLARY: 195 mg/dL — AB (ref 65–99)
Glucose-Capillary: 149 mg/dL — ABNORMAL HIGH (ref 65–99)
Glucose-Capillary: 129 mg/dL — ABNORMAL HIGH (ref 65–99)
Glucose-Capillary: 208 mg/dL — ABNORMAL HIGH (ref 65–99)

## 2016-03-03 LAB — ECHOCARDIOGRAM COMPLETE
HEIGHTINCHES: 73 in
WEIGHTICAEL: 3460.34 [oz_av]

## 2016-03-03 MED ORDER — INSULIN ASPART 100 UNIT/ML ~~LOC~~ SOLN
0.0000 [IU] | SUBCUTANEOUS | Status: DC
Start: 2016-03-03 — End: 2016-03-08
  Administered 2016-03-03: 3 [IU] via SUBCUTANEOUS
  Administered 2016-03-03 (×2): 2 [IU] via SUBCUTANEOUS
  Administered 2016-03-04 (×3): 1 [IU] via SUBCUTANEOUS
  Administered 2016-03-04: 2 [IU] via SUBCUTANEOUS
  Administered 2016-03-04 – 2016-03-05 (×3): 1 [IU] via SUBCUTANEOUS
  Administered 2016-03-05 (×2): 2 [IU] via SUBCUTANEOUS
  Administered 2016-03-05 – 2016-03-06 (×4): 1 [IU] via SUBCUTANEOUS
  Administered 2016-03-06 (×2): 2 [IU] via SUBCUTANEOUS
  Administered 2016-03-06: 1 [IU] via SUBCUTANEOUS

## 2016-03-03 NOTE — Progress Notes (Signed)
Upon reassessment pt was unresponsive and pupils were unequal. MD aware and at bedside. Orders received. Sedation decreased, will continue to monitor. Tillman Abide
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
## 2016-03-03 NOTE — Progress Notes (Signed)
Rt notified RN of critical Co2 in Blood gas. MD notified of ABG results. Tillman Abide
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
## 2016-03-03 NOTE — Progress Notes (Signed)
No acute events Intubated and sedated PERRL Intermittently following commands Moves all extremities Stable Supportive care

## 2016-03-04 ENCOUNTER — Inpatient Hospital Stay (HOSPITAL_COMMUNITY): Payer: Medicaid Other

## 2016-03-04 LAB — GLUCOSE, CAPILLARY
GLUCOSE-CAPILLARY: 129 mg/dL — AB (ref 65–99)
GLUCOSE-CAPILLARY: 126 mg/dL — AB (ref 65–99)
GLUCOSE-CAPILLARY: 128 mg/dL — AB (ref 65–99)
Glucose-Capillary: 112 mg/dL — ABNORMAL HIGH (ref 65–99)
Glucose-Capillary: 121 mg/dL — ABNORMAL HIGH (ref 65–99)
Glucose-Capillary: 141 mg/dL — ABNORMAL HIGH (ref 65–99)
Glucose-Capillary: 166 mg/dL — ABNORMAL HIGH (ref 65–99)

## 2016-03-04 LAB — CBC WITH DIFFERENTIAL/PLATELET
BASOS PCT: 0 %
Basophils Absolute: 0 10*3/uL (ref 0.0–0.1)
EOS ABS: 0.1 10*3/uL (ref 0.0–0.7)
Eosinophils Relative: 1 %
HEMATOCRIT: 29.6 % — AB (ref 36.0–46.0)
Hemoglobin: 9.5 g/dL — ABNORMAL LOW (ref 12.0–15.0)
Lymphocytes Relative: 9 %
Lymphs Abs: 0.9 10*3/uL (ref 0.7–4.0)
MCH: 27.8 pg (ref 26.0–34.0)
MCHC: 32.4 g/dL (ref 30.0–36.0)
MCV: 85.8 fL (ref 78.0–100.0)
MONOS PCT: 10 %
Monocytes Absolute: 1 10*3/uL (ref 0.1–1.0)
NEUTROS PCT: 80 %
Neutro Abs: 8.2 10*3/uL — ABNORMAL HIGH (ref 1.7–7.7)
PLATELETS: 221 10*3/uL (ref 150–400)
RBC: 3.45 MIL/uL — ABNORMAL LOW (ref 3.87–5.11)
RDW: 13.4 % (ref 11.5–15.5)
WBC: 10.2 10*3/uL (ref 4.0–10.5)

## 2016-03-04 LAB — BASIC METABOLIC PANEL
Anion gap: 8 (ref 5–15)
BUN: 13 mg/dL (ref 6–20)
CALCIUM: 8.3 mg/dL — AB (ref 8.9–10.3)
CO2: 30 mmol/L (ref 22–32)
CREATININE: 0.48 mg/dL (ref 0.44–1.00)
Chloride: 102 mmol/L (ref 101–111)
GFR calc non Af Amer: >60 mL/min (ref >60)
Glucose, Bld: 154 mg/dL — ABNORMAL HIGH (ref 65–99)
Potassium: 3.6 mmol/L (ref 3.5–5.1)
Sodium: 140 mmol/L (ref 135–145)

## 2016-03-04 LAB — CULTURE, RESPIRATORY

## 2016-03-04 LAB — CULTURE, RESPIRATORY W GRAM STAIN

## 2016-03-04 LAB — VANCOMYCIN, TROUGH: Vancomycin Tr: 7 ug/mL — ABNORMAL LOW (ref 15–20)

## 2016-03-04 MED ORDER — SODIUM CHLORIDE 0.9 % IV SOLN
750.0000 mg | Freq: Two times a day (BID) | INTRAVENOUS | Status: DC
  Administered 2016-03-04 – 2016-03-05 (×3): 750 mg via INTRAVENOUS
  Filled 2016-03-04 (×4): qty 7.5

## 2016-03-04 MED ORDER — POLYETHYLENE GLYCOL 3350 17 G PO PACK
17.0000 g | PACK | Freq: Two times a day (BID) | ORAL | Status: DC | PRN
  Administered 2016-03-04: 17 g via ORAL
  Filled 2016-03-04: qty 1

## 2016-03-04 MED ORDER — CEFAZOLIN SODIUM-DEXTROSE 2-4 GM/100ML-% IV SOLN
2.0000 g | Freq: Three times a day (TID) | INTRAVENOUS | Status: DC
  Administered 2016-03-04 – 2016-03-08 (×13): 2 g via INTRAVENOUS
  Filled 2016-03-04 (×17): qty 100

## 2016-03-04 MED ORDER — VANCOMYCIN HCL 10 G IV SOLR
1500.0000 mg | Freq: Three times a day (TID) | INTRAVENOUS | Status: DC
  Filled 2016-03-04: qty 1500
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
## 2016-03-04 NOTE — Progress Notes (Signed)
Had some neurological changes yesterday but these have resolved. CT essentially stable Intubated and sedated PERRL Intermittently following Moves all extremities I suspect she may have had a seizure Start Keppra Continue supportive care

## 2016-03-05 ENCOUNTER — Inpatient Hospital Stay (HOSPITAL_COMMUNITY): Payer: Medicaid Other

## 2016-03-05 DIAGNOSIS — J9602 Acute respiratory failure with hypercapnia: Secondary | ICD-10-CM

## 2016-03-05 LAB — GLUCOSE, CAPILLARY
GLUCOSE-CAPILLARY: 191 mg/dL — AB (ref 65–99)
Glucose-Capillary: 130 mg/dL — ABNORMAL HIGH (ref 65–99)
Glucose-Capillary: 136 mg/dL — ABNORMAL HIGH (ref 65–99)
Glucose-Capillary: 175 mg/dL — ABNORMAL HIGH (ref 65–99)
Glucose-Capillary: 128 mg/dL — ABNORMAL HIGH (ref 65–99)

## 2016-03-05 LAB — CBC
HEMATOCRIT: 30.2 % — AB (ref 36.0–46.0)
HEMOGLOBIN: 9.7 g/dL — AB (ref 12.0–15.0)
MCH: 27.7 pg (ref 26.0–34.0)
MCHC: 32.1 g/dL (ref 30.0–36.0)
MCV: 86.3 fL (ref 78.0–100.0)
Platelets: 225 10*3/uL (ref 150–400)
RBC: 3.5 MIL/uL — AB (ref 3.87–5.11)
RDW: 13.3 % (ref 11.5–15.5)
WBC: 9.6 10*3/uL (ref 4.0–10.5)

## 2016-03-05 LAB — BASIC METABOLIC PANEL
Anion gap: 6 (ref 5–15)
BUN: 12 mg/dL (ref 6–20)
CHLORIDE: 106 mmol/L (ref 101–111)
CO2: 30 mmol/L (ref 22–32)
CREATININE: 0.41 mg/dL — AB (ref 0.44–1.00)
Calcium: 8.7 mg/dL — ABNORMAL LOW (ref 8.9–10.3)
GFR calc non Af Amer: >60 mL/min (ref >60)
Glucose, Bld: 138 mg/dL — ABNORMAL HIGH (ref 65–99)
POTASSIUM: 3.7 mmol/L (ref 3.5–5.1)
Sodium: 142 mmol/L (ref 135–145)

## 2016-03-05 LAB — TRIGLYCERIDES: TRIGLYCERIDES: 79 mg/dL (ref <150)

## 2016-03-05 LAB — BLOOD GAS, ARTERIAL
ACID-BASE DEFICIT: 1.3 mmol/L (ref 0.0–2.0)
ACID-BASE EXCESS: 3.8 mmol/L — AB (ref 0.0–2.0)
BICARBONATE: 24.4 meq/L — AB (ref 20.0–24.0)
BICARBONATE: 28.2 meq/L — AB (ref 20.0–24.0)
Drawn by: 275531
Drawn by: 36277
FIO2: 0.3
FIO2: 0.3
LHR: 16 {breaths}/min
LHR: 16 {breaths}/min
MECHVT: 500 mL
MECHVT: 500 mL
O2 SAT: 96 %
O2 SAT: 98.7 %
PATIENT TEMPERATURE: 98.6
PCO2 ART: 52.4 mmHg — AB (ref 35.0–45.0)
PEEP/CPAP: 5 cmH2O
PEEP: 5 cmH2O
PO2 ART: 81.6 mmHg (ref 80.0–100.0)
PO2 ART: 110 mmHg — AB (ref 80.0–100.0)
Patient temperature: 98.6
TCO2: 26 mmol/L (ref 0–100)
TCO2: 29.6 mmol/L (ref 0–100)
pCO2 arterial: 45.3 mmHg — ABNORMAL HIGH (ref 35.0–45.0)
pH, Arterial: 7.289 — ABNORMAL LOW (ref 7.350–7.450)
pH, Arterial: 7.41 (ref 7.350–7.450)

## 2016-03-05 MED ORDER — PIVOT 1.5 CAL PO LIQD
1000.0000 mL | ORAL | Status: DC
  Administered 2016-03-06: 1000 mL

## 2016-03-05 MED ORDER — PRO-STAT SUGAR FREE PO LIQD
60.0000 mL | Freq: Two times a day (BID) | ORAL | Status: DC
  Administered 2016-03-05: 60 mL
  Filled 2016-03-05: qty 60

## 2016-03-05 MED ORDER — CHLORHEXIDINE GLUCONATE 0.12 % MT SOLN
OROMUCOSAL | Status: AC
  Filled 2016-03-05: qty 15

## 2016-03-05 MED ORDER — QUETIAPINE FUMARATE 100 MG PO TABS
100.0000 mg | ORAL_TABLET | Freq: Two times a day (BID) | ORAL | Status: DC
Start: 2016-03-05 — End: 2016-03-07
  Administered 2016-03-05 (×2): 100 mg
  Filled 2016-03-05 (×2): qty 1

## 2016-03-05 MED ORDER — CLONAZEPAM 1 MG PO TABS
1.0000 mg | ORAL_TABLET | Freq: Two times a day (BID) | ORAL | Status: DC
  Administered 2016-03-05 – 2016-03-08 (×5): 1 mg
  Filled 2016-03-05 (×5): qty 1

## 2016-03-05 NOTE — Progress Notes (Addendum)
Patient ID: Theresa SprayHeather Henderson, female   DOB: 1990/10/08, 25 y.o.   MRN: [id number] BP 111/60   Pulse 78   Temp 100.2 F (37.9 C) (Core (Comment))   Resp 14   Ht 6\' 1"  (1.854 m)   Wt 98 kg (216 lb 0.8 oz)   LMP 02/29/2016 (Exact Date)   SpO2 100%   BMI 28.50 kg/m  Alert, opening her eyes spontaneously. Moving all extremities  following commands Certainly improving.  Continuing the weaning process.

## 2016-03-05 NOTE — Progress Notes (Signed)
Patient ID: Theresa Henderson, female   DOB: 25-Jun-1991, 25 y.o.   MRN: [id number] I met with her mother and cousin at the bedside and updated them on her status and the plan of care. Georganna Skeans, MD, MPH, FACS Trauma: [phone] General Surgery: [phone]
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
## 2016-03-06 ENCOUNTER — Inpatient Hospital Stay (HOSPITAL_COMMUNITY): Payer: Medicaid Other

## 2016-03-06 LAB — BASIC METABOLIC PANEL
ANION GAP: 6 (ref 5–15)
BUN: 11 mg/dL (ref 6–20)
CHLORIDE: 102 mmol/L (ref 101–111)
CO2: 31 mmol/L (ref 22–32)
Calcium: 8.5 mg/dL — ABNORMAL LOW (ref 8.9–10.3)
Creatinine, Ser: 0.35 mg/dL — ABNORMAL LOW (ref 0.44–1.00)
GFR calc non Af Amer: >60 mL/min (ref >60)
Glucose, Bld: 189 mg/dL — ABNORMAL HIGH (ref 65–99)
POTASSIUM: 3.7 mmol/L (ref 3.5–5.1)
Sodium: 139 mmol/L (ref 135–145)

## 2016-03-06 LAB — GLUCOSE, CAPILLARY
GLUCOSE-CAPILLARY: 165 mg/dL — AB (ref 65–99)
GLUCOSE-CAPILLARY: 132 mg/dL — AB (ref 65–99)
GLUCOSE-CAPILLARY: 153 mg/dL — AB (ref 65–99)
GLUCOSE-CAPILLARY: 134 mg/dL — AB (ref 65–99)
GLUCOSE-CAPILLARY: 120 mg/dL — AB (ref 65–99)
Glucose-Capillary: 150 mg/dL — ABNORMAL HIGH (ref 65–99)
Glucose-Capillary: 160 mg/dL — ABNORMAL HIGH (ref 65–99)

## 2016-03-06 LAB — CBC
HEMATOCRIT: 29.4 % — AB (ref 36.0–46.0)
HEMOGLOBIN: 9.5 g/dL — AB (ref 12.0–15.0)
MCH: 27.7 pg (ref 26.0–34.0)
MCHC: 32.3 g/dL (ref 30.0–36.0)
MCV: 85.7 fL (ref 78.0–100.0)
Platelets: 225 10*3/uL (ref 150–400)
RBC: 3.43 MIL/uL — ABNORMAL LOW (ref 3.87–5.11)
RDW: 13.2 % (ref 11.5–15.5)
WBC: 8.4 10*3/uL (ref 4.0–10.5)

## 2016-03-06 MED ORDER — CHLORHEXIDINE GLUCONATE 0.12 % MT SOLN
15.0000 mL | Freq: Two times a day (BID) | OROMUCOSAL | Status: DC
  Administered 2016-03-06 – 2016-03-07 (×3): 15 mL via OROMUCOSAL
  Filled 2016-03-06 (×3): qty 15

## 2016-03-06 MED ORDER — FUROSEMIDE 10 MG/ML IJ SOLN
20.0000 mg | Freq: Once | INTRAMUSCULAR | Status: AC
Start: 2016-03-06 — End: 2016-03-06
  Administered 2016-03-06: 20 mg via INTRAVENOUS
  Filled 2016-03-06: qty 2

## 2016-03-06 MED ORDER — LORAZEPAM 2 MG/ML IJ SOLN
1.0000 mg | INTRAMUSCULAR | Status: DC | PRN
  Administered 2016-03-07 – 2016-03-08 (×4): 1 mg via INTRAVENOUS
  Filled 2016-03-06 (×5): qty 1

## 2016-03-06 MED ORDER — CETYLPYRIDINIUM CHLORIDE 0.05 % MT LIQD
7.0000 mL | Freq: Two times a day (BID) | OROMUCOSAL | Status: DC
  Administered 2016-03-07 – 2016-03-08 (×3): 7 mL via OROMUCOSAL

## 2016-03-06 MED ORDER — POTASSIUM CHLORIDE IN NACL 20-0.9 MEQ/L-% IV SOLN
INTRAVENOUS | Status: DC
  Administered 2016-03-06: 10:00:00 via INTRAVENOUS
  Administered 2016-03-07: 50 mL/h via INTRAVENOUS
  Filled 2016-03-06 (×4): qty 1000

## 2016-03-06 NOTE — Progress Notes (Signed)
Patient ID: Teodoro SprayHeather Henderson, female   DOB: 03-26-1991, 26 y.o.   MRN: [id number] Got up to chair with nursing. C spine with some posterior tenderness - will re-examine in AM. May need flex ex. Therapies ordered.  Violeta GelinasBurke Takuya Lariccia, MD, MPH, FACS Trauma: [phone] General Surgery: [phone]

## 2016-03-06 NOTE — Procedures (Signed)
Extubation Procedure Note  Patient Details:   Name: Theresa Henderson DOB: 1990/12/14 MRN: [id number]   Airway Documentation:    + air leak test prior to extubation.  Prior to extubation, pt vomited.  Oral suctioned immediately.  ETT suctioned immediately- no vomit noted in ETT suction.  Per MD proceed w/ extubation.  Evaluation  O2 sats: stable throughout Complications: No apparent complications Patient did tolerate procedure well. Bilateral Breath Sounds: Clear, Other (Comment) (coarse)   Yes, pt able to speak.  No distress noted.  BBSH coarse t/o, mild right side rhonchi.  Sat 98-100% on 4 lpm [place].  No stridor noted.    Jennette KettleBrowning, Gabe Glace Joy 03/06/2016, 9:11 AM
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
## 2016-03-06 NOTE — Progress Notes (Signed)
Patient ID: Theresa SprayHeather Henderson, female   DOB: 10-Nov-1990, 25 y.o.   MRN: [id number] BP 128/68 (BP Location: Left Arm)   Pulse 70   Temp 100.2 F (37.9 C) (Core (Comment))   Resp 16   Ht 6\' 1"  (1.854 m)   Wt 98.6 kg (217 lb 6 oz)   LMP 02/29/2016 (Exact Date)   SpO2 97%   BMI 28.68 kg/m  Alert, extubated. Following commands. Still with some confusion Moving all extremities Perrl, full eom Voice is hoarse

## 2016-03-06 NOTE — Progress Notes (Signed)
Remainder of fentanyl drip wasted in sink, 240 cc. Witnessed by Ivery QualeSean Lee, RN.

## 2016-03-06 NOTE — Progress Notes (Signed)
Pt successfully extubated today.  Hopeful to start therapies with TBI team as soon as possible.  Spoke with pt's mom visiting from [place]; she states she plans to stay with pt as she recovers at the uncle's home to help care for her.  Will follow progress.    Quintella BatonJulie W. Damien Batty, RN, BSN  Trauma/Neuro ICU Case Manager [phone]

## 2016-03-07 ENCOUNTER — Inpatient Hospital Stay (HOSPITAL_COMMUNITY): Payer: Medicaid Other

## 2016-03-07 DIAGNOSIS — S069X9A Unspecified intracranial injury with loss of consciousness of unspecified duration, initial encounter: Secondary | ICD-10-CM

## 2016-03-07 LAB — CBC WITH DIFFERENTIAL/PLATELET
BASOS PCT: 1 %
Basophils Absolute: 0.1 10*3/uL (ref 0.0–0.1)
Eosinophils Absolute: 0.1 10*3/uL (ref 0.0–0.7)
Eosinophils Relative: 1 %
HEMATOCRIT: 30 % — AB (ref 36.0–46.0)
HEMOGLOBIN: 9.8 g/dL — AB (ref 12.0–15.0)
LYMPHS ABS: 1.5 10*3/uL (ref 0.7–4.0)
Lymphocytes Relative: 14 %
MCH: 27.5 pg (ref 26.0–34.0)
MCHC: 32.7 g/dL (ref 30.0–36.0)
MCV: 84 fL (ref 78.0–100.0)
MONO ABS: 1.1 10*3/uL — AB (ref 0.1–1.0)
MONOS PCT: 11 %
NEUTROS ABS: 8 10*3/uL — AB (ref 1.7–7.7)
Neutrophils Relative %: 73 %
Platelets: 313 10*3/uL (ref 150–400)
RBC: 3.57 MIL/uL — ABNORMAL LOW (ref 3.87–5.11)
RDW: 13 % (ref 11.5–15.5)
WBC: 10.8 10*3/uL — ABNORMAL HIGH (ref 4.0–10.5)

## 2016-03-07 LAB — CULTURE, BLOOD (ROUTINE X 2)
Culture: NO GROWTH
Culture: NO GROWTH

## 2016-03-07 LAB — GLUCOSE, CAPILLARY
GLUCOSE-CAPILLARY: 108 mg/dL — AB (ref 65–99)
GLUCOSE-CAPILLARY: 114 mg/dL — AB (ref 65–99)
GLUCOSE-CAPILLARY: 98 mg/dL (ref 65–99)
Glucose-Capillary: 96 mg/dL (ref 65–99)
Glucose-Capillary: 115 mg/dL — ABNORMAL HIGH (ref 65–99)
Glucose-Capillary: 104 mg/dL — ABNORMAL HIGH (ref 65–99)
Glucose-Capillary: 93 mg/dL (ref 65–99)

## 2016-03-07 LAB — BASIC METABOLIC PANEL
Anion gap: 8 (ref 5–15)
BUN: 12 mg/dL (ref 6–20)
CALCIUM: 8.7 mg/dL — AB (ref 8.9–10.3)
CHLORIDE: 103 mmol/L (ref 101–111)
CO2: 28 mmol/L (ref 22–32)
CREATININE: 0.49 mg/dL (ref 0.44–1.00)
GFR calc non Af Amer: >60 mL/min (ref >60)
GLUCOSE: 103 mg/dL — AB (ref 65–99)
Potassium: 3.5 mmol/L (ref 3.5–5.1)
Sodium: 139 mmol/L (ref 135–145)

## 2016-03-07 MED ORDER — PANTOPRAZOLE SODIUM 40 MG PO PACK
40.0000 mg | PACK | Freq: Every day | ORAL | Status: DC
  Filled 2016-03-07 (×2): qty 20

## 2016-03-07 MED ORDER — ENSURE ENLIVE PO LIQD
237.0000 mL | Freq: Two times a day (BID) | ORAL | Status: DC
  Administered 2016-03-08: 237 mL via ORAL

## 2016-03-07 MED ORDER — MORPHINE SULFATE (PF) 2 MG/ML IV SOLN
2.0000 mg | INTRAVENOUS | Status: DC | PRN
  Administered 2016-03-07 – 2016-03-08 (×4): 2 mg via INTRAVENOUS
  Filled 2016-03-07 (×5): qty 1

## 2016-03-07 MED ORDER — ADULT MULTIVITAMIN LIQUID CH
15.0000 mL | Freq: Every day | ORAL | Status: DC
  Filled 2016-03-07 (×2): qty 15

## 2016-03-07 MED ORDER — RESOURCE THICKENUP CLEAR PO POWD
ORAL | Status: DC | PRN
  Filled 2016-03-07 (×2): qty 125

## 2016-03-07 MED ORDER — METOPROLOL TARTRATE 5 MG/5ML IV SOLN: 10.0000 mg | Freq: Four times a day (QID) | INTRAVENOUS | Status: DC

## 2016-03-07 MED ORDER — FLUOXETINE HCL 20 MG PO CAPS
20.0000 mg | ORAL_CAPSULE | Freq: Every day | ORAL | Status: DC
Start: 2016-03-08 — End: 2016-03-08
  Administered 2016-03-08: 20 mg via ORAL
  Filled 2016-03-07: qty 1

## 2016-03-07 NOTE — Progress Notes (Signed)
Patient ID: Theresa SprayHeather Henderson, female   DOB: 14-Oct-1990, 25 y.o.   MRN: [id number] BP 102/60   Pulse 88   Temp 99.1 F (37.3 C) (Oral)   Resp 17   Ht 6\' 1"  (1.854 m)   Wt 96.7 kg (213 lb 3 oz)   LMP 02/29/2016 (Exact Date)   SpO2 99%   BMI 28.13 kg/m  Progressing nicely, improving neurologically Moving all extremities Perrl, full eom Symmetric facies, tongue and uvula midline Transferred today
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
## 2016-03-07 NOTE — Progress Notes (Signed)
Trauma Service Note  Subjective: Patient has been asking for the trauma surgeon all night to explain her situation.  Agitated  Objective: Vital signs in last 24 hours: Temp:  [97.9 F (36.6 C)-100.2 F (37.9 C)] 98.7 F (37.1 C) (08/09 0400) Pulse Rate:  [59-102] 78 (08/09 0700) Resp:  [0-21] 17 (08/09 0700) BP: (102-134)/(54-78) 112/70 (08/09 0700) SpO2:  [90 %-100 %] 94 % (08/09 0700) FiO2 (%):  [30 %] 30 % (08/08 0800) Weight:  [96.7 kg (213 lb 3 oz)] 96.7 kg (213 lb 3 oz) (08/09 0141) Last BM Date: 03/06/16  Intake/Output from previous day: 08/08 0701 - 08/09 0700 In: 1118.4 [I.V.:997.7; NG/GT:20.7; IV Piggyback:100] Out: 4920 [Urine:4920] Intake/Output this shift: No intake/output data recorded.  General: No apparent painful distress.  Adequate.  Lungs: Clear  Abd: Soft, good bowel sounds.  Extremities: No changes  Neuro: Intact, confused.  Moves all fours.  Lab Results: CBC   Recent Labs  03/06/16 0425 03/07/16 0330  WBC 8.4 10.8*  HGB 9.5* 9.8*  HCT 29.4* 30.0*  PLT 225 313   BMET  Recent Labs  03/06/16 0425 03/07/16 0330  NA 139 139  K 3.7 3.5  CL 102 103  CO2 31 28  GLUCOSE 189* 103*  BUN 11 12  CREATININE 0.35* 0.49  CALCIUM 8.5* 8.7*   PT/INR No results for input(s): LABPROT, INR in the last 72 hours. ABG  Recent Labs  03/05/16 0424  PHART 7.410  HCO3 28.2*    Studies/Results: Dg Chest Port 1 View  Result Date: 03/07/2016 CLINICAL DATA:  Trauma. EXAM: PORTABLE CHEST 1 VIEW COMPARISON:  03/06/2016 FINDINGS: Endotracheal and gastric decompression tubes have been removed. Right-sided PICC line tip is stable at the SVC/ RA junction. No pneumothorax. Stable bibasilar atelectasis/ infiltrates remain, right greater than left. No pneumothorax. The heart size is normal. IMPRESSION: Stable bibasilar atelectasis/ infiltrates, right greater than left. Status post extubation. Electronically Signed   By: Irish Lack M.D.   On: 03/07/2016  07:28   Dg Chest Port 1 View  Result Date: 03/06/2016 CLINICAL DATA:  Intubation. EXAM: PORTABLE CHEST 1 VIEW COMPARISON:  03/05/2016 . FINDINGS: Endotracheal tube, NG tube, right PICC line in stable position. Diffuse bilateral pulmonary infiltrates noted consistent bilateral pneumonia and/or edema, progressive from prior exam. No pleural effusion or pneumothorax. Cardiomegaly. No acute bony abnormality . IMPRESSION: 1. Lines and tubes in stable position. 2. Progressive bilateral pulmonary infiltrates consistent with bilateral pneumonia and/or pulmonary edema. 3.  Cardiomegaly. Electronically Signed   By: Maisie Fus  Register   On: 03/06/2016 07:57    Anti-infectives: Anti-infectives    Start     Dose/Rate Route Frequency Ordered Stop   03/04/16 1700  vancomycin (VANCOCIN) 1,500 mg in sodium chloride 0.9 % 500 mL IVPB  Status:  Discontinued     1,500 mg 250 mL/hr over 120 Minutes Intravenous Every 8 hours 03/04/16 1037 03/04/16 1348   03/04/16 1400  ceFAZolin (ANCEF) IVPB 2g/100 mL premix     2 g 200 mL/hr over 30 Minutes Intravenous Every 8 hours 03/04/16 1348     03/02/16 1700  vancomycin (VANCOCIN) IVPB 1000 mg/200 mL premix  Status:  Discontinued     1,000 mg 200 mL/hr over 60 Minutes Intravenous Every 8 hours 03/02/16 0824 03/04/16 1037   03/02/16 1000  ceFEPIme (MAXIPIME) 1 g in dextrose 5 % 50 mL IVPB  Status:  Discontinued     1 g 100 mL/hr over 30 Minutes Intravenous Every 12 hours 03/02/16 0806  03/02/16 0824   03/02/16 0900  ceFEPIme (MAXIPIME) 1 g in dextrose 5 % 50 mL IVPB  Status:  Discontinued     1 g 100 mL/hr over 30 Minutes Intravenous Every 8 hours 03/02/16 0824 03/04/16 1348   03/02/16 0900  vancomycin (VANCOCIN) 1,500 mg in sodium chloride 0.9 % 500 mL IVPB     1,500 mg 250 mL/hr over 120 Minutes Intravenous  Once 03/02/16 0824 03/02/16 1200      Assessment/Plan: s/Sppech therapy consultation for swallowing evaluation and cognitive evaluation.Sppech therapy consultation  for swallowing evaluation and cognitive evaluation.  Rehab consultation. Probably transfer later today.  LOS: 8 days   Marta LamasJames O. Gae BonWyatt, III, MD, FACS [phone] Trauma Surgeon 03/07/2016

## 2016-03-08 ENCOUNTER — Encounter (HOSPITAL_COMMUNITY): Payer: Self-pay | Admitting: Nurse Practitioner

## 2016-03-08 ENCOUNTER — Encounter (HOSPITAL_COMMUNITY): Payer: Self-pay

## 2016-03-08 ENCOUNTER — Inpatient Hospital Stay (HOSPITAL_COMMUNITY)
Admission: RE | Admit: 2016-03-08 | Discharge: 2016-03-23 | DRG: 949 | Disposition: A | Discharge status: Home Health | Payer: Medicaid Other | Source: Intra-hospital | Attending: Physical Medicine & Rehabilitation | Admitting: Physical Medicine & Rehabilitation

## 2016-03-08 DIAGNOSIS — S069X9S Unspecified intracranial injury with loss of consciousness of unspecified duration, sequela: Secondary | ICD-10-CM

## 2016-03-08 DIAGNOSIS — G44309 Post-traumatic headache, unspecified, not intractable: Secondary | ICD-10-CM | POA: Diagnosis present

## 2016-03-08 DIAGNOSIS — R4189 Other symptoms and signs involving cognitive functions and awareness: Secondary | ICD-10-CM | POA: Diagnosis present

## 2016-03-08 DIAGNOSIS — J690 Pneumonitis due to inhalation of food and vomit: Secondary | ICD-10-CM | POA: Diagnosis present

## 2016-03-08 DIAGNOSIS — R4587 Impulsiveness: Secondary | ICD-10-CM | POA: Diagnosis present

## 2016-03-08 DIAGNOSIS — S069X3A Unspecified intracranial injury with loss of consciousness of 1 hour to 5 hours 59 minutes, initial encounter: Secondary | ICD-10-CM | POA: Diagnosis present

## 2016-03-08 DIAGNOSIS — R519 Headache, unspecified: Secondary | ICD-10-CM

## 2016-03-08 DIAGNOSIS — R42 Dizziness and giddiness: Secondary | ICD-10-CM

## 2016-03-08 DIAGNOSIS — S06890D Other specified intracranial injury without loss of consciousness, subsequent encounter: Secondary | ICD-10-CM | POA: Diagnosis present

## 2016-03-08 DIAGNOSIS — J9601 Acute respiratory failure with hypoxia: Secondary | ICD-10-CM

## 2016-03-08 DIAGNOSIS — G4700 Insomnia, unspecified: Secondary | ICD-10-CM | POA: Diagnosis present

## 2016-03-08 DIAGNOSIS — S27329A Contusion of lung, unspecified, initial encounter: Secondary | ICD-10-CM | POA: Diagnosis present

## 2016-03-08 DIAGNOSIS — H9041 Sensorineural hearing loss, unilateral, right ear, with unrestricted hearing on the contralateral side: Secondary | ICD-10-CM | POA: Diagnosis present

## 2016-03-08 DIAGNOSIS — F09 Unspecified mental disorder due to known physiological condition: Secondary | ICD-10-CM

## 2016-03-08 DIAGNOSIS — F0281 Dementia in other diseases classified elsewhere with behavioral disturbance: Secondary | ICD-10-CM

## 2016-03-08 DIAGNOSIS — F418 Other specified anxiety disorders: Secondary | ICD-10-CM | POA: Diagnosis present

## 2016-03-08 DIAGNOSIS — J9602 Acute respiratory failure with hypercapnia: Secondary | ICD-10-CM

## 2016-03-08 DIAGNOSIS — S27329D Contusion of lung, unspecified, subsequent encounter: Secondary | ICD-10-CM | POA: Diagnosis not present

## 2016-03-08 DIAGNOSIS — R51 Headache: Secondary | ICD-10-CM

## 2016-03-08 DIAGNOSIS — S069X9A Unspecified intracranial injury with loss of consciousness of unspecified duration, initial encounter: Secondary | ICD-10-CM

## 2016-03-08 DIAGNOSIS — F02B18 Dementia in other diseases classified elsewhere, moderate, with other behavioral disturbance: Secondary | ICD-10-CM

## 2016-03-08 LAB — GLUCOSE, CAPILLARY
GLUCOSE-CAPILLARY: 110 mg/dL — AB (ref 65–99)
GLUCOSE-CAPILLARY: 119 mg/dL — AB (ref 65–99)
Glucose-Capillary: 110 mg/dL — ABNORMAL HIGH (ref 65–99)

## 2016-03-08 LAB — MRSA PCR SCREENING: MRSA by PCR: NEGATIVE

## 2016-03-08 LAB — TRIGLYCERIDES: Triglycerides: 82 mg/dL (ref <150)

## 2016-03-08 MED ORDER — QUETIAPINE FUMARATE 25 MG PO TABS
25.0000 mg | ORAL_TABLET | Freq: Three times a day (TID) | ORAL | Status: DC | PRN
  Administered 2016-03-10 – 2016-03-11 (×2): 25 mg via ORAL
  Filled 2016-03-08 (×2): qty 1

## 2016-03-08 MED ORDER — ASPIRIN 325 MG PO TABS
325.0000 mg | ORAL_TABLET | Freq: Every day | ORAL | Status: DC
  Administered 2016-03-09 – 2016-03-23 (×15): 325 mg via ORAL
  Filled 2016-03-08 (×15): qty 1

## 2016-03-08 MED ORDER — FLUOXETINE HCL 20 MG PO CAPS
20.0000 mg | ORAL_CAPSULE | Freq: Every day | ORAL | Status: DC
  Administered 2016-03-09 – 2016-03-16 (×8): 20 mg via ORAL
  Filled 2016-03-08 (×7): qty 1

## 2016-03-08 MED ORDER — TOPIRAMATE 25 MG PO TABS
50.0000 mg | ORAL_TABLET | Freq: Every day | ORAL | Status: DC
  Administered 2016-03-08 – 2016-03-11 (×4): 50 mg via ORAL
  Filled 2016-03-08 (×4): qty 2

## 2016-03-08 MED ORDER — ONDANSETRON HCL 4 MG PO TABS
4.0000 mg | ORAL_TABLET | Freq: Four times a day (QID) | ORAL | Status: DC | PRN
  Administered 2016-03-23: 4 mg via ORAL
  Filled 2016-03-08: qty 1

## 2016-03-08 MED ORDER — PANTOPRAZOLE SODIUM 40 MG PO TBEC
40.0000 mg | DELAYED_RELEASE_TABLET | Freq: Every day | ORAL | Status: DC
  Filled 2016-03-08: qty 1

## 2016-03-08 MED ORDER — CIPROFLOXACIN HCL 500 MG PO TABS
500.0000 mg | ORAL_TABLET | Freq: Two times a day (BID) | ORAL | Status: AC
  Administered 2016-03-08 – 2016-03-10 (×4): 500 mg via ORAL
  Filled 2016-03-08 (×4): qty 1

## 2016-03-08 MED ORDER — TRAZODONE HCL 50 MG PO TABS
25.0000 mg | ORAL_TABLET | Freq: Every evening | ORAL | Status: DC | PRN
  Administered 2016-03-08 – 2016-03-22 (×8): 50 mg via ORAL
  Filled 2016-03-08 (×8): qty 1

## 2016-03-08 MED ORDER — ACETAMINOPHEN 325 MG PO TABS: 650.0000 mg | ORAL_TABLET | ORAL | Status: DC | PRN

## 2016-03-08 MED ORDER — METOPROLOL TARTRATE 25 MG PO TABS
25.0000 mg | ORAL_TABLET | Freq: Two times a day (BID) | ORAL | Status: DC
  Administered 2016-03-08 – 2016-03-17 (×17): 25 mg via ORAL
  Filled 2016-03-08 (×19): qty 1

## 2016-03-08 MED ORDER — ONDANSETRON HCL 4 MG/2ML IJ SOLN: 4.0000 mg | Freq: Four times a day (QID) | INTRAMUSCULAR | Status: DC | PRN

## 2016-03-08 MED ORDER — BISACODYL 10 MG RE SUPP: 10.0000 mg | Freq: Every day | RECTAL | Status: DC | PRN

## 2016-03-08 MED ORDER — SODIUM CHLORIDE 0.9% FLUSH: 10.0000 mL | Freq: Two times a day (BID) | INTRAVENOUS | Status: DC

## 2016-03-08 MED ORDER — ENOXAPARIN SODIUM 40 MG/0.4ML ~~LOC~~ SOLN
40.0000 mg | SUBCUTANEOUS | Status: DC
  Administered 2016-03-08 – 2016-03-22 (×14): 40 mg via SUBCUTANEOUS
  Filled 2016-03-08 (×15): qty 0.4

## 2016-03-08 MED ORDER — ADULT MULTIVITAMIN W/MINERALS CH
1.0000 | ORAL_TABLET | Freq: Every day | ORAL | Status: DC
Start: 2016-03-09 — End: 2016-03-23
  Administered 2016-03-09 – 2016-03-23 (×15): 1 via ORAL
  Filled 2016-03-08 (×15): qty 1

## 2016-03-08 MED ORDER — SODIUM CHLORIDE 0.9% FLUSH
10.0000 mL | INTRAVENOUS | Status: DC | PRN
Start: 2016-03-08 — End: 2016-03-08

## 2016-03-08 MED ORDER — DOCUSATE SODIUM 100 MG PO CAPS
100.0000 mg | ORAL_CAPSULE | Freq: Two times a day (BID) | ORAL | Status: DC
  Administered 2016-03-09 – 2016-03-12 (×7): 100 mg via ORAL
  Filled 2016-03-08 (×7): qty 1

## 2016-03-08 MED ORDER — PROCHLORPERAZINE 25 MG RE SUPP: 12.5000 mg | Freq: Four times a day (QID) | RECTAL | Status: DC | PRN

## 2016-03-08 MED ORDER — CLONAZEPAM 0.5 MG PO TABS: 1.0000 mg | ORAL_TABLET | Freq: Two times a day (BID) | ORAL | Status: DC

## 2016-03-08 MED ORDER — DIPHENHYDRAMINE HCL 12.5 MG/5ML PO ELIX: 12.5000 mg | ORAL_SOLUTION | Freq: Four times a day (QID) | ORAL | Status: DC | PRN

## 2016-03-08 MED ORDER — RESOURCE THICKENUP CLEAR PO POWD
ORAL | Status: DC | PRN
  Filled 2016-03-08: qty 125

## 2016-03-08 MED ORDER — ADULT MULTIVITAMIN W/MINERALS CH
1.0000 | ORAL_TABLET | Freq: Every day | ORAL | Status: DC
Start: 2016-03-08 — End: 2016-03-08
  Filled 2016-03-08: qty 1

## 2016-03-08 MED ORDER — CLONAZEPAM 0.5 MG PO TABS
1.0000 mg | ORAL_TABLET | Freq: Three times a day (TID) | ORAL | Status: DC
  Administered 2016-03-08 – 2016-03-22 (×39): 1 mg via ORAL
  Filled 2016-03-08 (×40): qty 2

## 2016-03-08 MED ORDER — PROCHLORPERAZINE MALEATE 5 MG PO TABS: 5.0000 mg | ORAL_TABLET | Freq: Four times a day (QID) | ORAL | Status: DC | PRN

## 2016-03-08 MED ORDER — ALUM & MAG HYDROXIDE-SIMETH 200-200-20 MG/5ML PO SUSP: 30.0000 mL | ORAL | Status: DC | PRN

## 2016-03-08 MED ORDER — OXYCODONE HCL 5 MG PO TABS
5.0000 mg | ORAL_TABLET | ORAL | Status: DC | PRN
  Administered 2016-03-08 – 2016-03-23 (×31): 5 mg via ORAL
  Filled 2016-03-08 (×32): qty 1

## 2016-03-08 MED ORDER — SODIUM CHLORIDE 0.9% FLUSH: 10.0000 mL | INTRAVENOUS | Status: DC | PRN

## 2016-03-08 MED ORDER — PROCHLORPERAZINE EDISYLATE 5 MG/ML IJ SOLN: 5.0000 mg | Freq: Four times a day (QID) | INTRAMUSCULAR | Status: DC | PRN

## 2016-03-08 MED ORDER — PANTOPRAZOLE SODIUM 40 MG PO TBEC
40.0000 mg | DELAYED_RELEASE_TABLET | Freq: Every day | ORAL | Status: DC
  Administered 2016-03-09 – 2016-03-23 (×15): 40 mg via ORAL
  Filled 2016-03-08 (×15): qty 1

## 2016-03-08 MED ORDER — FLEET ENEMA 7-19 GM/118ML RE ENEM: 1.0000 | ENEMA | Freq: Once | RECTAL | Status: DC | PRN

## 2016-03-08 MED ORDER — ENSURE ENLIVE PO LIQD
237.0000 mL | Freq: Two times a day (BID) | ORAL | Status: DC
  Administered 2016-03-09 – 2016-03-14 (×11): 237 mL via ORAL

## 2016-03-08 MED ORDER — ACETAMINOPHEN 325 MG PO TABS
325.0000 mg | ORAL_TABLET | ORAL | Status: DC | PRN
  Administered 2016-03-08 – 2016-03-12 (×2): 650 mg via ORAL
  Administered 2016-03-13: 325 mg via ORAL
  Administered 2016-03-15 – 2016-03-19 (×3): 650 mg via ORAL
  Filled 2016-03-08 (×6): qty 2

## 2016-03-08 MED ORDER — GUAIFENESIN-DM 100-10 MG/5ML PO SYRP: 5.0000 mL | ORAL_SOLUTION | Freq: Four times a day (QID) | ORAL | Status: DC | PRN

## 2016-03-08 NOTE — Progress Notes (Signed)
Patient ID: Theresa SprayHeather Henderson, female   DOB: [birth year], 25 y.o.   MRN: [id number] Patient admitted to [phone] [id number] via wheelchair, escorted by nursing staff and family.  Patient and family verbalized understanding of rehab process, signed fall safety agreement.  Appears to be in no immediate distress at this time, however patient is very tearful and complaining of headache.  Called Theresa Love, PA for orders for anxiety.  New orders received and medications administered.  Patient impulsive, ambulated to bathroom without staff assistance but with family present despite family telling her not to do so.  Will continue to monitor closely.  Theresa Henderson, Jovonda Selner J, RN
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
## 2016-03-08 NOTE — Progress Notes (Signed)
Pt has been accepted for admission to [facility], and per MD is stable for discharge there today.    Quintella BatonJulie W. Tresa Jolley, RN, BSN  Trauma/Neuro ICU Case Manager [phone]

## 2016-03-09 ENCOUNTER — Inpatient Hospital Stay (HOSPITAL_COMMUNITY): Payer: Medicaid Other | Admitting: Occupational Therapy

## 2016-03-09 ENCOUNTER — Inpatient Hospital Stay (HOSPITAL_COMMUNITY): Payer: Medicaid Other

## 2016-03-09 ENCOUNTER — Inpatient Hospital Stay (HOSPITAL_COMMUNITY): Payer: Medicaid Other | Admitting: Speech Pathology

## 2016-03-09 DIAGNOSIS — S069X9D Unspecified intracranial injury with loss of consciousness of unspecified duration, subsequent encounter: Secondary | ICD-10-CM

## 2016-03-09 LAB — COMPREHENSIVE METABOLIC PANEL
ALK PHOS: 111 U/L (ref 38–126)
ALT: 37 U/L (ref 14–54)
AST: 25 U/L (ref 15–41)
Albumin: 3.3 g/dL — ABNORMAL LOW (ref 3.5–5.0)
Anion gap: 12 (ref 5–15)
BILIRUBIN TOTAL: 0.6 mg/dL (ref 0.3–1.2)
BUN: 10 mg/dL (ref 6–20)
CHLORIDE: 104 mmol/L (ref 101–111)
CO2: 23 mmol/L (ref 22–32)
CREATININE: 0.6 mg/dL (ref 0.44–1.00)
Calcium: 9.1 mg/dL (ref 8.9–10.3)
GFR calc Af Amer: >60 mL/min (ref >60)
Glucose, Bld: 98 mg/dL (ref 65–99)
Potassium: 3.9 mmol/L (ref 3.5–5.1)
Sodium: 139 mmol/L (ref 135–145)
Total Protein: 6.2 g/dL — ABNORMAL LOW (ref 6.5–8.1)

## 2016-03-09 LAB — URINALYSIS, ROUTINE W REFLEX MICROSCOPIC
BILIRUBIN URINE: NEGATIVE
Glucose, UA: NEGATIVE mg/dL
HGB URINE DIPSTICK: NEGATIVE
Ketones, ur: NEGATIVE mg/dL
Leukocytes, UA: NEGATIVE
Nitrite: NEGATIVE
Protein, ur: NEGATIVE mg/dL
SPECIFIC GRAVITY, URINE: 1.019 (ref 1.005–1.030)
pH: 7 (ref 5.0–8.0)

## 2016-03-09 LAB — CBC WITH DIFFERENTIAL/PLATELET
BASOS ABS: 0.1 10*3/uL (ref 0.0–0.1)
Basophils Relative: 1 %
Eosinophils Absolute: 0.2 10*3/uL (ref 0.0–0.7)
Eosinophils Relative: 2 %
HEMATOCRIT: 34 % — AB (ref 36.0–46.0)
HEMOGLOBIN: 11.1 g/dL — AB (ref 12.0–15.0)
LYMPHS PCT: 16 %
Lymphs Abs: 1.7 10*3/uL (ref 0.7–4.0)
MCH: 27.2 pg (ref 26.0–34.0)
MCHC: 32.6 g/dL (ref 30.0–36.0)
MCV: 83.3 fL (ref 78.0–100.0)
Monocytes Absolute: 0.7 10*3/uL (ref 0.1–1.0)
Monocytes Relative: 7 %
NEUTROS ABS: 7.6 10*3/uL (ref 1.7–7.7)
NEUTROS PCT: 74 %
PLATELETS: 342 10*3/uL (ref 150–400)
RBC: 4.08 MIL/uL (ref 3.87–5.11)
RDW: 12.9 % (ref 11.5–15.5)
WBC: 10.3 10*3/uL (ref 4.0–10.5)
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
## 2016-03-09 NOTE — Progress Notes (Signed)
Subjective/Complaints: Perseverating on going home, mother at bedside. Poor sleep last night, frequent urination as well as headache  Review systems: Denies shortness of breath, nausea, vomiting, diarrhea or constipation. Limited by cognition, poor attention and perseveration  Objective: Vital Signs: Blood pressure (!) 118/58, pulse 68, temperature 99.3 F (37.4 C), temperature source Oral, resp. rate 18, height 6' 2"  (1.88 m), weight 98.2 kg (216 lb 8 oz), last menstrual period 02/29/2016, SpO2 100 %. No results found. Results for orders placed or performed during the hospital encounter of 03/08/16 (from the past 72 hour(s))  CBC WITH DIFFERENTIAL     Status: Abnormal   Collection Time: 03/09/16  6:35 AM  Result Value Ref Range   WBC 10.3 4.0 - 10.5 K/uL   RBC 4.08 3.87 - 5.11 MIL/uL   Hemoglobin 11.1 (L) 12.0 - 15.0 g/dL   HCT 34.0 (L) 36.0 - 46.0 %   MCV 83.3 78.0 - 100.0 fL   MCH 27.2 26.0 - 34.0 pg   MCHC 32.6 30.0 - 36.0 g/dL   RDW 12.9 11.5 - 15.5 %   Platelets 342 150 - 400 K/uL   Neutrophils Relative % 74 %   Neutro Abs 7.6 1.7 - 7.7 K/uL   Lymphocytes Relative 16 %   Lymphs Abs 1.7 0.7 - 4.0 K/uL   Monocytes Relative 7 %   Monocytes Absolute 0.7 0.1 - 1.0 K/uL   Eosinophils Relative 2 %   Eosinophils Absolute 0.2 0.0 - 0.7 K/uL   Basophils Relative 1 %   Basophils Absolute 0.1 0.0 - 0.1 K/uL  Comprehensive metabolic panel     Status: Abnormal   Collection Time: 03/09/16  6:35 AM  Result Value Ref Range   Sodium 139 135 - 145 mmol/L   Potassium 3.9 3.5 - 5.1 mmol/L   Chloride 104 101 - 111 mmol/L   CO2 23 22 - 32 mmol/L   Glucose, Bld 98 65 - 99 mg/dL   BUN 10 6 - 20 mg/dL   Creatinine, Ser 0.60 0.44 - 1.00 mg/dL   Calcium 9.1 8.9 - 10.3 mg/dL   Total Protein 6.2 (L) 6.5 - 8.1 g/dL   Albumin 3.3 (L) 3.5 - 5.0 g/dL   AST 25 15 - 41 U/L   ALT 37 14 - 54 U/L   Alkaline Phosphatase 111 38 - 126 U/L   Total Bilirubin 0.6 0.3 - 1.2 mg/dL   GFR calc non Af Amer  >60 >60 mL/min   GFR calc Af Amer >60 >60 mL/min    Comment: (NOTE) The eGFR has been calculated using the CKD EPI equation. This calculation has not been validated in all clinical situations. eGFR's persistently <60 mL/min signify possible Chronic Kidney Disease.    Anion gap 12 5 - 15      General: No acute distress Mood and affect are appropriate Heart: Regular rate and rhythm no rubs murmurs or extra sounds Lungs: Clear to auscultation, breathing unlabored, no rales or wheezes Abdomen: Positive bowel sounds, soft nontender to palpation, nondistended Extremities: No clubbing, cyanosis, or edema Skin: No evidence of breakdown, no evidence of rash, ecchymosis behind the right ear Neurologic: Cranial nerves II through XII intact, motor strength is 5/5 in bilateral deltoid, bicep, tricep, grip, hip flexor, knee extensors, ankle dorsiflexor and plantar flexor Sensory exam normal sensation to light touch and proprioception in bilateral upper and lower extremities Cerebellar exam normal finger to nose to finger as well as heel to shin in bilateral upper and lower  extremities Musculoskeletal: Full range of motion in all 4 extremities. No joint swelling   Assessment/Plan: 1. 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 Physiatrist is providing close team supervision and 24 hour management of active medical problems listed below. Physiatrist and rehab team continue to assess barriers to discharge/monitor patient progress toward functional and medical goals. FIM:          Function - Air cabin crew transfer assistive device: Bedside commode, Grab bar, Walker Assist level to toilet: Moderate assist (Pt 50 - 74%/lift or lower) Assist level from toilet: Moderate assist (Pt 50 - 74%/lift or lower) Assist level to bedside  commode (at bedside): Moderate assist (Pt 50 - 74%/lift or lower) Assist level from bedside commode (at bedside): Moderate assist (Pt 50 - 74%/lift or lower)        Function - Comprehension Comprehension assist level: Follows complex conversation/direction with no assist  Function - Expression Expression: Verbal Expression assist level: Expresses complex ideas: With extra time/assistive device  Function - Social Interaction Social Interaction assist level: Interacts appropriately 90% of the time - Needs monitoring or encouragement for participation or interaction.  Function - Problem Solving Problem solving assist level: Solves basic 25 - 49% of the time - needs direction more than half the time to initiate, plan or complete simple activities  Function - Memory Memory assist level: Recognizes or recalls 25 - 49% of the time/requires cueing 50 - 75% of the time   Medical Problem List and Plan: 1. Severe cognitive deficits, decreased balance secondary to Traumatic brain injury, initiate CIR PT, OT, speech 2. DVT Prophylaxis/Anticoagulation: Mechanical: Sequential compression devices, below knee Bilateral lower extremities 3. Pain Management: oxycodone 16m Q4H prn 4. Mood: emotional lability, psychosocial support, may need SSRI vs Depakote if worsens 5. Neuropsych: This patient is not capable of making decisions on her own behalf. 6. Skin/Wound Care: monitor PICC site, no decubiti 7. Fluids/Electrolytes/Nutrition: Check CMET, may need supplementation 8.  ?Seizure taken off Keppra by NS, will monitor 9.  Pulmonary contusions as well as aspiration pneumonia. Will change IV Ancef to po Cipro 2 days 10.  Post traumatic headache start topamax qhs need to adjust dose to twice a day 11. Tachycardia with episode of A flutter was on beta blocker as recommended by cardiology Dr NMeda Coffeewill restart 12. Insomnia. Multifactorial, continue analgesics as well as Topamax for headache pain,  manage agitation from TBI, urinary frequency, check UA, however, doubt UTI given antibiotic use LOS (Days) 1 A FACE TO FACE EVALUATION WAS PERFORMED  Theresa Henderson E 03/09/2016, 9:03 AM
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
## 2016-03-09 NOTE — Progress Notes (Signed)
Speech Language Pathology Daily Session Note  Patient Details  Name: Theresa SprayHeather Henderson MRN: [id number] Date of Birth: 09-01-1990  Today's Date: 03/09/2016 SLP Individual Time: 1500-1530 SLP Individual Time Calculation (min): 30 min   Short Term Goals: Week 1: SLP Short Term Goal 1 (Week 1): Pt to answer simple y/n questions with min A with 100% acc. SLP Short Term Goal 2 (Week 1): Pt to demonstrate sustained attention to functional task with mod A. SLP Short Term Goal 3 (Week 1): Pt to demonstrate reading comprehension at the complex sentence- short paragraph level with min A.  SLP Short Term Goal 4 (Week 1): Pt to tolerate trials of thin liquids with min A for use of compensatory strategies without overt s/s aspiration.  SLP Short Term Goal 5 (Week 1): Pt demonstrate O x4 with min A.   Skilled Therapeutic Interventions:Pt seen for skilled SLP treatment targeting cognitive goals. Pt required max A to observe swallow precautions with trials of thin liquids. With one attempt to use verbal cues only, pt took large bolus and demonstrated immediate cough- tactile and verbal cueing needed to adhere to precautions. Pt Ox self, hospital and Aug 2017 this afternoon with min A to retrieve information. When asked to write her name the pt began writing all of her family members names in sentence format, "My name is .Marland Kitchen.Marland Kitchen.Marland Kitchen.My mom's name is...." Pt required max cueing for topic maintenance. Pt less tearful and more easily redirected this afternoon. No family present.     Function:  Eating Eating   Modified Consistency Diet: Yes Eating Assist Level: Set up assist for;Supervision or verbal cues;Help managing cup/glass           Cognition Comprehension Comprehension assist level: Understands basic 25 - 49% of the time/ requires cueing 50 - 75% of the time  Expression   Expression assist level: Expresses basic 25 - 49% of the time/requires cueing 50 - 75% of the time. Uses single words/gestures.  Social  Interaction Social Interaction assist level: Interacts appropriately 25 - 49% of time - Needs frequent redirection.  Problem Solving Problem solving assist level: Solves basic less than 25% of the time - needs direction nearly all the time or does not effectively solve problems and may need a restraint for safety  Memory Memory assist level: Recognizes or recalls less than 25% of the time/requires cueing greater than 75% of the time    Pain Pain Assessment Pain Assessment: No/denies pain  Therapy/Group: Individual Therapy  Rocky CraftsKara E Clarisa Danser MA, CCC-SLP 03/09/2016, 4:10 PM
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
## 2016-03-09 NOTE — Interval H&P Note (Signed)
Theresa SprayHeather Behrman was admitted today to Inpatient Rehabilitation with the diagnosis of TBI.  The patient's history has been reviewed, patient examined, and there is no change in status.  Patient continues to be appropriate for intensive inpatient rehabilitation.  I have reviewed the patient's chart and labs.  Questions were answered to the patient's satisfaction. The PAPE has been reviewed and assessment remains appropriate.  KIRSTEINS,ANDREW E 03/09/2016, 9:02 AM

## 2016-03-10 ENCOUNTER — Inpatient Hospital Stay (HOSPITAL_COMMUNITY): Payer: Self-pay

## 2016-03-10 ENCOUNTER — Inpatient Hospital Stay (HOSPITAL_COMMUNITY): Payer: Medicaid Other | Admitting: Physical Therapy

## 2016-03-10 ENCOUNTER — Inpatient Hospital Stay (HOSPITAL_COMMUNITY): Payer: Medicaid Other | Admitting: Speech Pathology

## 2016-03-10 DIAGNOSIS — S06890D Other specified intracranial injury without loss of consciousness, subsequent encounter: Principal | ICD-10-CM

## 2016-03-10 NOTE — Progress Notes (Signed)
Occupational Therapy Session Note  Patient Details  Name: Theresa SprayHeather Henderson MRN: [id number] Date of Birth: 09-07-1990  Today's Date: 03/10/2016 OT Individual Time: 1000-1100 OT Individual Time Calculation (min): 60 min   Short Term Goals: Week 1:  OT Short Term Goal 1 (Week 1): Pt will complete grooming standing at sink level with min assistance for dynamic balance. OT Short Term Goal 2 (Week 1): Pt will tolerate 30 minutes of therapeutic intervention with no emotional outbursts, as appropriate. OT Short Term Goal 3 (Week 1): Pt will don socks and shoes with supervision for increase in LB dressing independence. OT Short Term Goal 4 (Week 1): Pt will attend to 3 minutes of cognitive task with zero need for redirection.  Skilled Therapeutic Interventions/Progress Updates:   ADL-retraining at shower level with focus on sustained attention, safety awareness, endurance, dynamic standing balance, improved endurance and transfer retraining.  With min instructional cues patient completed bed mobility and ambulated to shower with steadying assist.   Pt toileted and proceeded to shower to wash her hair, shave her legs, and perform bathing.   Pt requires close supervision for safety but only intermittent redirection throughout task to sustain attention and provide clarification and reinforcement for  problem-solving.   Pt required min assist to rinse her hair, moderate assist to brush it out, and steadying assist when standing to wash her buttocks.  Pt dressed at edge of bed requesting privacy screen due to personal modesty.    Pt left in bed at end of session with bed alarm activated.  Therapy Documentation Precautions:  Precautions Precautions: Fall Precaution Comments: impulsive Restrictions Weight Bearing Restrictions: No   Pain: Pain Assessment Faces Pain Scale: No hurt   ADL: ADL Where Assessed-Upper Body Bathing:  (sitting on tub bench) ADL Comments: see functional navigator  See Function  Navigator for Current Functional Status.   Therapy/Group: Individual Therapy  Theresa Henderson 03/10/2016, 12:51 PM

## 2016-03-10 NOTE — Progress Notes (Signed)
Patient ID: Theresa Henderson, female   DOB: 1991-02-23, 25 y.o.   MRN: [id number]   03/10/16.   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  Talkative, pleasantly confused  Review systems: Denies shortness of breath, nausea, vomiting, diarrhea or constipation. Limited by cognition, poor attention and perseveration  Past Medical History:  Diagnosis Date  . Asthma   . Depression      Objective: Vital Signs: Blood pressure 125/69, pulse 92, temperature 98.8 F (37.1 C), temperature source Oral, resp. rate 18, height 6' 2"  (1.88 m), weight 201 lb (91.2 kg), last menstrual period 02/29/2016, SpO2 98 %. No results found. Results for orders placed or performed during the hospital encounter of 03/08/16 (from the past 72 hour(s))  CBC WITH DIFFERENTIAL     Status: Abnormal   Collection Time: 03/09/16  6:35 AM  Result Value Ref Range   WBC 10.3 4.0 - 10.5 K/uL   RBC 4.08 3.87 - 5.11 MIL/uL   Hemoglobin 11.1 (L) 12.0 - 15.0 g/dL   HCT 34.0 (L) 36.0 - 46.0 %   MCV 83.3 78.0 - 100.0 fL   MCH 27.2 26.0 - 34.0 pg   MCHC 32.6 30.0 - 36.0 g/dL   RDW 12.9 11.5 - 15.5 %   Platelets 342 150 - 400 K/uL   Neutrophils Relative % 74 %   Neutro Abs 7.6 1.7 - 7.7 K/uL   Lymphocytes Relative 16 %   Lymphs Abs 1.7 0.7 - 4.0 K/uL   Monocytes Relative 7 %   Monocytes Absolute 0.7 0.1 - 1.0 K/uL   Eosinophils Relative 2 %   Eosinophils Absolute 0.2 0.0 - 0.7 K/uL   Basophils Relative 1 %   Basophils Absolute 0.1 0.0 - 0.1 K/uL  Comprehensive metabolic panel     Status: Abnormal   Collection Time: 03/09/16  6:35 AM  Result Value Ref Range   Sodium 139 135 - 145 mmol/L   Potassium 3.9 3.5 - 5.1 mmol/L   Chloride 104 101 - 111 mmol/L   CO2 23 22 - 32 mmol/L   Glucose, Bld 98 65 - 99 mg/dL   BUN 10 6 - 20 mg/dL   Creatinine, Ser 0.60 0.44 -  1.00 mg/dL   Calcium 9.1 8.9 - 10.3 mg/dL   Total Protein 6.2 (L) 6.5 - 8.1 g/dL   Albumin 3.3 (L) 3.5 - 5.0 g/dL   AST 25 15 - 41 U/L   ALT 37 14 - 54 U/L   Alkaline Phosphatase 111 38 - 126 U/L   Total Bilirubin 0.6 0.3 - 1.2 mg/dL   GFR calc non Af Amer >60 >60 mL/min   GFR calc Af Amer >60 >60 mL/min    Comment: (NOTE) The eGFR has been calculated using the CKD EPI equation. This calculation has not been validated in all clinical situations. eGFR's persistently <60 mL/min signify possible Chronic Kidney Disease.    Anion gap 12 5 - 15  Urinalysis, Routine w reflex microscopic (not at [facility])     Status: Abnormal   Collection Time: 03/09/16  6:57 PM  Result Value Ref Range   Color, Urine YELLOW YELLOW   APPearance HAZY (A) CLEAR   Specific Gravity, Urine 1.019 1.005 - 1.030   pH 7.0 5.0 - 8.0   Glucose, UA NEGATIVE NEGATIVE mg/dL   Hgb urine dipstick NEGATIVE NEGATIVE   Bilirubin Urine NEGATIVE NEGATIVE  Ketones, ur NEGATIVE NEGATIVE mg/dL   Protein, ur NEGATIVE NEGATIVE mg/dL   Nitrite NEGATIVE NEGATIVE   Leukocytes, UA NEGATIVE NEGATIVE    Comment: MICROSCOPIC NOT DONE ON URINES WITH NEGATIVE PROTEIN, BLOOD, LEUKOCYTES, NITRITE, OR GLUCOSE <1000 mg/dL.      General: No acute distress Pleasantly confused Heart: Regular rate and rhythm no rubs murmurs or extra sounds Lungs: Clear to auscultation, breathing unlabored, no rales or wheezes Abdomen: Positive bowel sounds, soft nontender to palpation, nondistended Extremities: No clubbing, cyanosis, or edema Skin: No evidence of breakdown, no evidence of rash, ecchymosis behind the right ear Neurologic: Cranial nerves II through XII intact, motor strength is 5/5 in bilateral deltoid, bicep, tricep, grip, hip flexor, knee extensors, ankle dorsiflexor and plantar flexor Sensory exam normal sensation to light touch and proprioception in bilateral upper and lower extremities Cerebellar exam normal finger to nose to finger as well  as heel to shin in bilateral upper and lower extremities Musculoskeletal: Full range of motion in all 4 extremities. No joint swelling   Medical Problem List and Plan: 1. 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 DVT Prophylaxis/Anticoagulation: Mechanical: Sequential compression devices, below knee Bilateral lower extremities 3. Pain Management: oxycodone 61m Q4H prn  4.   ?Seizure taken off Keppra by NS, will monitor  LOS (Days) 2 A FACE TO FACE EVALUATION WAS PERFORMED  KNyoka Cowden8/06/2016, 10:13 AM

## 2016-03-10 NOTE — Progress Notes (Signed)
Physical Therapy Session Note  Patient Details  Name: Theresa Henderson MRN: [id number] Date of Birth: March 24, 1991  Today's Date: 03/10/2016 PT Individual Time: 1415-1445 PT Individual Time Calculation (min): 30 min    Short Term Goals: Week 1:  PT Short Term Goal 1 (Week 1): Pt will be able to gait with min assist in distracted environment PT Short Term Goal 2 (Week 1): Pt will be able to demonstrate dynamic standing balance during functional tasks with steadying assist PT Short Term Goal 3 (Week 1): Pt will be able to demonstrate sustained attention with mod assist during sessions  Skilled Therapeutic Interventions/Progress Updates:    Pt was seen bedside in the pm with family at bedside. Pt transferred supine to edge of bed, edge of bed to supine with S. Pt performed all sit to stand, stand pivot and toilet transfers with S to min guard with verbal cues. Pt ambulated around room without assistive device and S to min guard with verbal cues. Pt utilized Kinetron at 20 cm/sec for 30 seconds in standing, 30 seconds sitting and 30 seconds in standing. Pt c/o pain\soreness B knees with activity. Pt ambulated 150 feet and 125 feet without assistive device and min guard. Pt tends to perseverate requiring redirection and reinforcement that she is doing well with therapy. Pt returned to room. Pt left sitting up in bed with nurse at bedside to give pt medication for headache with call bell within reach.   Therapy Documentation Precautions:  Precautions Precautions: Fall Precaution Comments: impulsive Restrictions Weight Bearing Restrictions: No General: PT Amount of Missed Time (min): 15 Minutes PT Missed Treatment Reason: Pain Vital Signs:  Pain: Pt c/o 9/10 pain/headache, pt's nurse notified.  See Function Navigator for Current Functional Status.   Therapy/Group: Individual Therapy  Rayford HalstedMitchell, Wavie Hashimi G 03/10/2016, 3:33 PM

## 2016-03-10 NOTE — Progress Notes (Signed)
Speech Language Pathology Daily Session Note  Patient Details  Name: Theresa SprayHeather Henderson MRN: [id number] Date of Birth: Nov 08, 1990  Today's Date: 03/10/2016 SLP Individual Time: 1130-1200 SLP Individual Time Calculation (min): 30 min   Short Term Goals: Week 1: SLP Short Term Goal 1 (Week 1): Pt to answer simple y/n questions with min A with 100% acc. SLP Short Term Goal 2 (Week 1): Pt to demonstrate sustained attention to functional task with mod A. SLP Short Term Goal 3 (Week 1): Pt to demonstrate reading comprehension at the complex sentence- short paragraph level with min A.  SLP Short Term Goal 4 (Week 1): Pt to tolerate trials of thin liquids with min A for use of compensatory strategies without overt s/s aspiration.  SLP Short Term Goal 5 (Week 1): Pt demonstrate O x4 with min A.   Skilled Therapeutic Interventions: Skilled treatment session focused on cognition goals. SLP facilitated session by providing Max A verbal cues for sustained attention to functional task. Pt able to answer general/simple yes/no questions about immediate family with Mod I. Pt oriented to self, hospital, month, year and that she was in a "accident." Pt refused trials of thin liquids or ice chips this session. Pt left in bed with all needs within reach.   Function:  Eating Eating   Modified Consistency Diet: Yes Eating Assist Level: Set up assist for;Supervision or verbal cues;Help managing cup/glass   Eating Set Up Assist For: Opening containers       Cognition Comprehension Comprehension assist level: Understands basic 25 - 49% of the time/ requires cueing 50 - 75% of the time  Expression   Expression assist level: Expresses basic 25 - 49% of the time/requires cueing 50 - 75% of the time. Uses single words/gestures.  Social Interaction Social Interaction assist level: Interacts appropriately 25 - 49% of time - Needs frequent redirection.  Problem Solving Problem solving assist level: Solves basic less  than 25% of the time - needs direction nearly all the time or does not effectively solve problems and may need a restraint for safety  Memory Memory assist level: Recognizes or recalls less than 25% of the time/requires cueing greater than 75% of the time    Pain Pain Assessment Pain Assessment: 0-10 Pain Score: 7  Pain Type: Acute pain Pain Location: Head Pain Descriptors / Indicators: Headache Pain Frequency: Intermittent Pain Onset: Other (Comment) (possibly with bright light) Pain Intervention(s): Medication (See eMAR)  Therapy/Group: Individual Therapy  Theresa Henderson 03/10/2016, 3:15 PM

## 2016-03-10 NOTE — Progress Notes (Signed)
Physical Therapy Session Note  Patient Details  Name: Theresa Henderson Orsino MRN: [id number] Date of Birth: 1991/07/22  Today's Date: 03/10/2016 PT Individual Time: 0800-0900 PT Individual Time Calculation (min): 60 min    Short Term Goals: Week 1:  PT Short Term Goal 1 (Week 1): Pt will be able to gait with min assist in distracted environment PT Short Term Goal 2 (Week 1): Pt will be able to demonstrate dynamic standing balance during functional tasks with steadying assist PT Short Term Goal 3 (Week 1): Pt will be able to demonstrate sustained attention with mod assist during sessions  Skilled Therapeutic Interventions/Progress Updates: Pt was seen bedside in the am, eating breakfast. Pt transferred supine to edge of bed with head of bed elevated, side rail and S. Pt performed all sit to stand transfers and stand pivot transfers with min guard to min A. Pt performed toilet transfers with min guard to min A. Pt able to ambulate around room with min guard to min A and verbal cues. Pt ambulated 150 feet x 2 without assistive device and min guard to min A with verbal cues. Pt ambulated slalom course 50 feet x 2 without assistive device and min guard to min A. In gym treatment focused on NMR utilizing alternating cone taps 3 sets x 10 reps each. Pt rode Nu-step 5 minutes x 2 at level 4 for NMR and generalized aerobic conditioning. Pt returned to room following treatment. Pt transferred w/c to edge of bed with min guard to min A and verbal cues. Pt able to transfer from edge of bed to supine with S. Pt perseverated throughout treatment requiring redirection at times to remain focused on task at hand. Pt left sitting up in bed with call bell within reach and bed alarm on.    Therapy Documentation Precautions:  Precautions Precautions: Fall Precaution Comments: impulsive Restrictions Weight Bearing Restrictions: No General:     Pain: Pt c/o 7/10 headache, pt's nurse at bedside to medicate during  treatment.  See Function Navigator for Current Functional Status.   Therapy/Group: Individual Therapy  Rayford HalstedMitchell, Benjermin Korber G 03/10/2016, 12:11 PM

## 2016-03-11 ENCOUNTER — Inpatient Hospital Stay (HOSPITAL_COMMUNITY): Payer: Medicaid Other

## 2016-03-11 MED ORDER — BISACODYL 5 MG PO TBEC
5.0000 mg | DELAYED_RELEASE_TABLET | Freq: Every day | ORAL | Status: DC | PRN
  Administered 2016-03-11: 5 mg via ORAL
  Filled 2016-03-11: qty 1

## 2016-03-11 NOTE — Progress Notes (Signed)
Patient ID: Theresa Henderson, female   DOB: 04/19/1991, 25 y.o.   MRN: [id number]  03/11/16.   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  Talkative, pleasantly confused.  Multiple issues addressed this am   Review systems: Denies shortness of breath, nausea, vomiting, diarrhea or constipation. Limited by cognition, poor attention and perseveration  Past Medical History:  Diagnosis Date  . Asthma   . Depression      Objective: Vital Signs: Blood pressure 109/62, pulse 78, temperature 98.7 F (37.1 C), temperature source Oral, resp. rate 18, height 6' 2" (1.88 m), weight 209 lb 8 oz (95 kg), last menstrual period 02/29/2016, SpO2 98 %. No results found. Results for orders placed or performed during the hospital encounter of 03/08/16 (from the past 72 hour(s))  CBC WITH DIFFERENTIAL     Status: Abnormal   Collection Time: 03/09/16  6:35 AM  Result Value Ref Range   WBC 10.3 4.0 - 10.5 K/uL   RBC 4.08 3.87 - 5.11 MIL/uL   Hemoglobin 11.1 (L) 12.0 - 15.0 g/dL   HCT 34.0 (L) 36.0 - 46.0 %   MCV 83.3 78.0 - 100.0 fL   MCH 27.2 26.0 - 34.0 pg   MCHC 32.6 30.0 - 36.0 g/dL   RDW 12.9 11.5 - 15.5 %   Platelets 342 150 - 400 K/uL   Neutrophils Relative % 74 %   Neutro Abs 7.6 1.7 - 7.7 K/uL   Lymphocytes Relative 16 %   Lymphs Abs 1.7 0.7 - 4.0 K/uL   Monocytes Relative 7 %   Monocytes Absolute 0.7 0.1 - 1.0 K/uL   Eosinophils Relative 2 %   Eosinophils Absolute 0.2 0.0 - 0.7 K/uL   Basophils Relative 1 %   Basophils Absolute 0.1 0.0 - 0.1 K/uL  Comprehensive metabolic panel     Status: Abnormal   Collection Time: 03/09/16  6:35 AM  Result Value Ref Range   Sodium 139 135 - 145 mmol/L   Potassium 3.9 3.5 - 5.1 mmol/L   Chloride 104 101 - 111 mmol/L   CO2 23 22 - 32 mmol/L   Glucose, Bld 98 65 - 99 mg/dL   BUN 10 6 - 20  mg/dL   Creatinine, Ser 0.60 0.44 - 1.00 mg/dL   Calcium 9.1 8.9 - 10.3 mg/dL   Total Protein 6.2 (L) 6.5 - 8.1 g/dL   Albumin 3.3 (L) 3.5 - 5.0 g/dL   AST 25 15 - 41 U/L   ALT 37 14 - 54 U/L   Alkaline Phosphatase 111 38 - 126 U/L   Total Bilirubin 0.6 0.3 - 1.2 mg/dL   GFR calc non Af Amer >60 >60 mL/min   GFR calc Af Amer >60 >60 mL/min    Comment: (NOTE) The eGFR has been calculated using the CKD EPI equation. This calculation has not been validated in all clinical situations. eGFR's persistently <60 mL/min signify possible Chronic Kidney Disease.    Anion gap 12 5 - 15  Urinalysis, Routine w reflex microscopic (not at [facility])     Status: Abnormal   Collection Time: 03/09/16  6:57 PM  Result Value Ref Range   Color, Urine YELLOW YELLOW   APPearance HAZY (A) CLEAR   Specific Gravity, Urine 1.019 1.005 - 1.030   pH 7.0 5.0 - 8.0   Glucose, UA NEGATIVE NEGATIVE mg/dL   Hgb urine dipstick NEGATIVE NEGATIVE  Bilirubin Urine NEGATIVE NEGATIVE   Ketones, ur NEGATIVE NEGATIVE mg/dL   Protein, ur NEGATIVE NEGATIVE mg/dL   Nitrite NEGATIVE NEGATIVE   Leukocytes, UA NEGATIVE NEGATIVE    Comment: MICROSCOPIC NOT DONE ON URINES WITH NEGATIVE PROTEIN, BLOOD, LEUKOCYTES, NITRITE, OR GLUCOSE <1000 mg/dL.      General: No acute distress Pleasantly confused Heart: Regular rate and rhythm no rubs murmurs or extra sounds Lungs: Clear to auscultation, breathing unlabored, no rales or wheezes Abdomen: Positive bowel sounds, soft nontender to palpation, nondistended Extremities: No clubbing, cyanosis, or edema Skin: scattered ecchymoses; healing wound dorsum R foot  IV line in place L arm Neurologic: Cranial nerves II through XII intact, motor strength is 5/5 in bilateral deltoid, bicep, tricep, grip, hip flexor, knee extensors, ankle dorsiflexor and plantar flexor Sensory exam normal sensation to light touch and proprioception in bilateral upper and lower extremities Cerebellar exam normal  finger to nose to finger as well as heel to shin in bilateral upper and lower extremities Musculoskeletal: Full range of motion in all 4 extremities. No joint swelling   Medical Problem List and Plan: 1. 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 DVT Prophylaxis/Anticoagulation: Mechanical: Sequential compression devices, below knee Bilateral lower extremities 3. Pain Management: oxycodone 5mg Q4H prn    LOS (Days) 3 A FACE TO FACE EVALUATION WAS PERFORMED  KWIATKOWSKI,PETER FRANK 03/11/2016, 9:07 AM   

## 2016-03-11 NOTE — Progress Notes (Signed)
Physical Therapy Session Note  Patient Details  Name: Theresa SprayHeather Henderson MRN: [id number] Date of Birth: 1990/12/23  Today's Date: 03/11/2016 PT Individual Time: 0800-0903 PT Individual Time Calculation (min): 63 min    Short Term Goals: Week 1:  PT Short Term Goal 1 (Week 1): Pt will be able to gait with min assist in distracted environment PT Short Term Goal 2 (Week 1): Pt will be able to demonstrate dynamic standing balance during functional tasks with steadying assist PT Short Term Goal 3 (Week 1): Pt will be able to demonstrate sustained attention with mod assist during sessions  Skilled Therapeutic Interventions/Progress Updates:    Session focused on addressing functional mobility, balance, safety awareness, and cognition during functional tasks including showering, dressing, gait, transfers, and Nustep. Pt required overall steadying assist throughout session with mild LOB's noted during turns or when obstacles present (no assistive device). Pt requires frequent redirection to task due to impaired attention and mod to max verbal cues throughout. Pt's mother present during part of session and educated throughout. End of session pt refused to keep safety belt on in w/c (was going to go with mom into dayroom for breakfast), so then chose to return to bed (with bed alarm on) until her mother was ready to take her. Suggested with nursing staff to use chair alarm instead of safety belt if pt wanting to be up in w/c.    Therapy Documentation Precautions:  Precautions Precautions: Fall Precaution Comments: impulsive Restrictions Weight Bearing Restrictions: No  Pain: Pain Assessment Pain Assessment: No/denies pain     See Function Navigator for Current Functional Status.   Therapy/Group: Individual Therapy  Karolee StampsGray, Theresa Henderson  Theresa Henderson, PT, DPT  03/11/2016, 9:34 AM

## 2016-03-11 NOTE — Plan of Care (Signed)
Problem: RH SAFETY Goal: RH STG ADHERE TO SAFETY PRECAUTIONS W/ASSISTANCE/DEVICE STG Adhere to Safety Precautions With min Assistance/Device.  Outcome: Not Progressing Patient VERY impulsive   Problem: RH COGNITION-NURSING Goal: RH STG ANTICIPATES NEEDS/CALLS FOR ASSIST W/ASSIST/CUES STG Anticipates Needs/Calls for Assist With min Assistance/Cues.  Outcome: Not Progressing Does not call for assistance consistently

## 2016-03-12 ENCOUNTER — Inpatient Hospital Stay (HOSPITAL_COMMUNITY): Payer: Medicaid Other | Admitting: Physical Therapy

## 2016-03-12 ENCOUNTER — Inpatient Hospital Stay (HOSPITAL_COMMUNITY): Payer: Medicaid Other | Admitting: Occupational Therapy

## 2016-03-12 ENCOUNTER — Encounter (HOSPITAL_COMMUNITY): Payer: Self-pay

## 2016-03-12 ENCOUNTER — Inpatient Hospital Stay (HOSPITAL_COMMUNITY): Payer: Medicaid Other | Admitting: Speech Pathology

## 2016-03-12 ENCOUNTER — Inpatient Hospital Stay (HOSPITAL_COMMUNITY): Payer: Self-pay | Admitting: Occupational Therapy

## 2016-03-12 ENCOUNTER — Inpatient Hospital Stay (HOSPITAL_COMMUNITY): Payer: Medicaid Other

## 2016-03-12 DIAGNOSIS — S069X3S Unspecified intracranial injury with loss of consciousness of 1 hour to 5 hours 59 minutes, sequela: Secondary | ICD-10-CM

## 2016-03-12 DIAGNOSIS — G4700 Insomnia, unspecified: Secondary | ICD-10-CM

## 2016-03-12 DIAGNOSIS — S06893D Other specified intracranial injury with loss of consciousness of 1 hour to 5 hours 59 minutes, subsequent encounter: Secondary | ICD-10-CM

## 2016-03-12 MED ORDER — SENNOSIDES-DOCUSATE SODIUM 8.6-50 MG PO TABS
1.0000 | ORAL_TABLET | Freq: Every day | ORAL | Status: DC
  Administered 2016-03-12 – 2016-03-22 (×6): 1 via ORAL
  Filled 2016-03-12 (×7): qty 1

## 2016-03-12 MED ORDER — DIVALPROEX SODIUM 250 MG PO DR TAB
250.0000 mg | DELAYED_RELEASE_TABLET | Freq: Two times a day (BID) | ORAL | Status: DC
  Administered 2016-03-12 – 2016-03-15 (×7): 250 mg via ORAL
  Filled 2016-03-12 (×7): qty 1

## 2016-03-12 MED ORDER — QUETIAPINE FUMARATE 25 MG PO TABS: 25.0000 mg | ORAL_TABLET | Freq: Every evening | ORAL | Status: DC | PRN

## 2016-03-12 MED ORDER — QUETIAPINE FUMARATE 25 MG PO TABS
25.0000 mg | ORAL_TABLET | Freq: Every day | ORAL | Status: DC
  Administered 2016-03-12 – 2016-03-14 (×3): 25 mg via ORAL
  Filled 2016-03-12 (×3): qty 1

## 2016-03-12 MED ORDER — TOPIRAMATE 25 MG PO TABS
25.0000 mg | ORAL_TABLET | Freq: Every day | ORAL | Status: DC
  Administered 2016-03-12 – 2016-03-14 (×3): 25 mg via ORAL
  Filled 2016-03-12 (×4): qty 1

## 2016-03-12 MED ORDER — CARBAMIDE PEROXIDE 6.5 % OT SOLN
5.0000 [drp] | Freq: Two times a day (BID) | OTIC | Status: DC
  Administered 2016-03-12 – 2016-03-15 (×7): 5 [drp] via OTIC
  Filled 2016-03-12: qty 15

## 2016-03-12 NOTE — Progress Notes (Signed)
Patient with increased crying and agitation at beginning of shift after multiple family members left.  Stated she was worried about meeting her doctor today and him telling her that she will not be able to go home next Friday as she wants to.  Patient and mother given encouragement but also told them that how well she does/participates in her therapy, as well as how well she does medically, are both factors in her discharge home.  States that she and her mother have multiple questions to ask MD when he arrives this morning.  Patient perseverated over her discharge date, the amount of time she was "comatose", and if the MD will find her "stupid".  Medicated with PRN seroquel with minimal relief.  Received PRN Trazodone and patient fell asleep approximately 2 hours later.  Mother and daughter have slept throughout the remainder of the shift.   Kelli HopeBarber, Lahela Woodin M

## 2016-03-12 NOTE — Progress Notes (Signed)
Occupational Therapy Session Note  Patient Details  Name: Theresa SprayHeather Kucher MRN: [id number] Date of Birth: 01-01-1991  Today's Date: 03/12/2016 OT Individual Time: [id number] OT Individual Time Calculation (min): 70 min  and Today's Date: 03/12/2016 OT Missed Time: 20 Minutes Missed Time Reason: Patient fatigue;Patient unwilling/refused to participate without medical reason, headache     Short Term Goals:Week 1:  OT Short Term Goal 1 (Week 1): Pt will complete grooming standing at sink level with min assistance for dynamic balance. OT Short Term Goal 2 (Week 1): Pt will tolerate 30 minutes of therapeutic intervention with no emotional outbursts, as appropriate. OT Short Term Goal 3 (Week 1): Pt will don socks and shoes with supervision for increase in LB dressing independence. OT Short Term Goal 4 (Week 1): Pt will attend to 3 minutes of cognitive task with zero need for redirection.  Skilled Therapeutic Interventions/Progress Updates:    Pt seen this session to facilitate cognitive skills of attention, awareness, balance, activity tolerance.  Pt asleep in bed but awoke easily asking to toilet and shower.  Pt ambulated with touching assist to toilet. Pt given distant S (therapist outside BR door with visual view). Pt instructed to tell this therapist when she needed to stand, but pt stood by herself. Therapist provided steadying A to shower. Pt shaved/ bathed with supervision. Ambulated to room to don shirt, underwear from EOB.  Pt then became very fatigued, complaining of severe headache. Pt layed down on unmade bed and did not want to wait for bed to be made. Pt allowed to rest while RN obtained medications. After 10 min of rest, pt continued to refuse to get up to don pants or brush teeth. Pt wanted help to don clothing. Therapist started pants over feet and pt pulled over hips from supine.  Pt with decreased perseveration during session, but continues with max impaired attention/ focus.  Pt in bed  with bed alarm on and mother in room with pt.   Therapy Documentation Precautions:  Precautions Precautions: Fall Precaution Comments: impulsive Restrictions Weight Bearing Restrictions: No  General OT Amount of Missed Time: 20 Minutes    Pain:   Pt c/o 9/10 headache pain.  Pt could not tolerate continuing with therapy session. Nurse provided pt with medication.  Pt rested for 10 min, but refused to continue.   ADL: ADL Where Assessed-Upper Body Bathing:  (sitting on tub bench) ADL Comments: see functional navigator  See Function Navigator for Current Functional Status.   Therapy/Group: Individual Therapy  SAGUIER,JULIA 03/12/2016, 11:37 AM

## 2016-03-12 NOTE — Progress Notes (Signed)
Occupational Therapy Session Note  Patient Details  Name: Theresa Henderson MRN: [id number] Date of Birth: 02-27-1991  Today's Date: 03/12/2016 OT Individual Time: 1115-1200 OT Individual Time Calculation (min): 45 min     Short Term Goals: Week 1:  OT Short Term Goal 1 (Week 1): Pt will complete grooming standing at sink level with min assistance for dynamic balance. OT Short Term Goal 2 (Week 1): Pt will tolerate 30 minutes of therapeutic intervention with no emotional outbursts, as appropriate. OT Short Term Goal 3 (Week 1): Pt will don socks and shoes with supervision for increase in LB dressing independence. OT Short Term Goal 4 (Week 1): Pt will attend to 3 minutes of cognitive task with zero need for redirection.  Skilled Therapeutic Interventions/Progress Updates:    Pt seen for OT session focusing on sustained attention and cognition. Pt asleep in supine upon arrival, and with encouragement agreeable to tx session on EOB.  Pt completed connect- four game seated EOB. She was able to attend to game to play 3 complete connect-4 games, however, then began to display perseveration tendencies to game. Mod VCs required for redirection for another cognitive activity.  Presented pt with pattern of color with game pieces and had pt replicate, requiring min VCs for pt to self correct one mistake.  She mentioned several times throughout session regarding wanting to d/c at end of week, she was able to be redirected back to task with min cuing.  Pt able to tolerate full 45 minute task and was appropriate throughout, often asking how she was doing with therapy.  Pt left seated EOB at end of session with hand off to SLP and mother present. While pt participated in OT session with OT student, OT worked on brushing knot out of pt's hair. Pt able to tolerate simultaneous hair brushing and connect 4 game with occasional cues.     Therapy Documentation Precautions:  Precautions Precautions:  Fall Precaution Comments: impulsive Restrictions Weight Bearing Restrictions: No  See Function Navigator for Current Functional Status.   Therapy/Group: Individual Therapy  Lewis, Jennier Schissler C 03/12/2016, 12:08 PM

## 2016-03-12 NOTE — Progress Notes (Signed)
Subjective/Complaints: Slept better last night. Has more mood lability at night. Has hard time being able to relax before falling asleep. Mother at bedside. Decreased hearing right ear.   Review systems: Denies shortness of breath, nausea, vomiting, diarrhea or constipation. Limited by cognition, poor attention and perseveration  Objective: Vital Signs: Blood pressure 104/62, pulse 91, temperature 99.3 F (37.4 C), temperature source Oral, resp. rate 18, height 6\' 2"  (1.88 m), weight 94.8 kg (209 lb 1.6 oz), last menstrual period 02/29/2016, SpO2 97 %. No results found. Results for orders placed or performed during the hospital encounter of 03/08/16 (from the past 72 hour(s))  Urinalysis, Routine w reflex microscopic (not at [facility])     Status: Abnormal   Collection Time: 03/09/16  6:57 PM  Result Value Ref Range   Color, Urine YELLOW YELLOW   APPearance HAZY (A) CLEAR   Specific Gravity, Urine 1.019 1.005 - 1.030   pH 7.0 5.0 - 8.0   Glucose, UA NEGATIVE NEGATIVE mg/dL   Hgb urine dipstick NEGATIVE NEGATIVE   Bilirubin Urine NEGATIVE NEGATIVE   Ketones, ur NEGATIVE NEGATIVE mg/dL   Protein, ur NEGATIVE NEGATIVE mg/dL   Nitrite NEGATIVE NEGATIVE   Leukocytes, UA NEGATIVE NEGATIVE    Comment: MICROSCOPIC NOT DONE ON URINES WITH NEGATIVE PROTEIN, BLOOD, LEUKOCYTES, NITRITE, OR GLUCOSE <1000 mg/dL.      General: No acute distress Mood and affect are appropriate Heart: Regular rate and rhythm no rubs murmurs or extra sounds Lungs: Clear to auscultation, breathing unlabored, no rales or wheezes Abdomen: Positive bowel sounds, soft nontender to palpation, nondistended Extremities: No clubbing, cyanosis, or edema Skin: No evidence of breakdown, no evidence of rash, ecchymosis behind the right ear Neurologic: Cranial nerves II through XII intact except for hearing right ear (dried blood however in ext canal).  motor strength is 5/5 in bilateral deltoid, bicep, tricep, grip, hip flexor,  knee extensors, ankle dorsiflexor and plantar flexor Sensory exam normal sensation to light touch and proprioception in bilateral upper and lower extremities Cerebellar exam normal finger to nose to finger as well as heel to shin in bilateral upper and lower extremities Pt impulsive, easily distracted/attention waxes and wanes. Non-irritable, cooperative. Needs frequent redirection. Can perseverate at times Musculoskeletal: Full range of motion in all 4 extremities. No joint swelling   Assessment/Plan: 1. 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 Physiatrist is providing close team supervision and 24 hour management of active medical problems listed below. Physiatrist and rehab team continue to assess barriers to discharge/monitor patient progress toward functional and medical goals. FIM: Function - Bathing Position: Shower Body parts bathed by patient: Right arm, Left arm, Chest, Abdomen, Front perineal area, Buttocks, Right upper leg, Right lower leg, Left upper leg, Left lower leg (refused back) Body parts bathed by helper: Back Assist Level: Touching or steadying assistance(Pt > 75%)  Function- Upper Body Dressing/Undressing What is the patient wearing?: Bra, Pull over shirt/dress Bra - Perfomed by patient: Thread/unthread right bra strap, Thread/unthread left bra strap Bra - Perfomed by helper: Thread/unthread left bra strap, Thread/unthread right bra strap, Hook/unhook bra (pull down sports bra) (tight sports bra) Pull over shirt/dress - Perfomed by patient: Thread/unthread right sleeve, Put head through opening, Thread/unthread left sleeve, Pull shirt over trunk Assist Level: Touching or steadying assistance(Pt > 75%) Function - Lower Body Dressing/Undressing What is the patient wearing?: Underwear, Pants, Socks,  Shoes Position: Sitting EOB Underwear - Performed by patient: Thread/unthread right underwear leg, Thread/unthread left underwear leg Pants- Performed by patient: Thread/unthread right pants leg, Thread/unthread left pants leg, Pull pants up/down Non-skid slipper socks- Performed by patient: Don/doff right sock, Don/doff left sock Socks - Performed by patient: Don/doff right sock Socks - Performed by helper: Don/doff right sock Shoes - Performed by patient: Don/doff left shoe Shoes - Performed by helper: Don/doff right shoe Assist for footwear: Partial/moderate assist Assist for lower body dressing: Touching or steadying assistance (Pt > 75%)  Function - Toileting Toileting steps completed by patient: Adjust clothing prior to toileting, Performs perineal hygiene, Adjust clothing after toileting Toileting Assistive Devices: Grab bar or rail Assist level: Supervision or verbal cues  Function - Archivist transfer assistive device: Grab bar Assist level to toilet: Supervision or verbal cues Assist level from toilet: Supervision or verbal cues Assist level to bedside commode (at bedside): Moderate assist (Pt 50 - 74%/lift or lower) Assist level from bedside commode (at bedside): Moderate assist (Pt 50 - 74%/lift or lower)  Function - Chair/bed transfer Chair/bed transfer method: Squat pivot, Ambulatory Chair/bed transfer assist level: Touching or steadying assistance (Pt > 75%) Chair/bed transfer assistive device: Armrests  Function - Locomotion: Wheelchair Will patient use wheelchair at discharge?: No Function - Locomotion: Ambulation Assistive device: No device Max distance: 150 Assist level: Touching or steadying assistance (Pt > 75%) Assist level: Touching or steadying assistance (Pt > 75%) Assist level: Touching or steadying assistance (Pt > 75%) Assist level: Touching or steadying assistance (Pt > 75%) Assist level: Touching or steadying assistance (Pt >  75%)  Function - Comprehension Comprehension: Auditory Comprehension assist level: Understands basic 50 - 74% of the time/ requires cueing 25 - 49% of the time  Function - Expression Expression: Verbal Expression assist level: Expresses basic 75 - 89% of the time/requires cueing 10 - 24% of the time. Needs helper to occlude trach/needs to repeat words.  Function - Social Interaction Social Interaction assist level: Interacts appropriately 25 - 49% of time - Needs frequent redirection.  Function - Problem Solving Problem solving assist level: Solves basic 25 - 49% of the time - needs direction more than half the time to initiate, plan or complete simple activities  Function - Memory Memory assist level: Recognizes or recalls less than 25% of the time/requires cueing greater than 75% of the time Patient normally able to recall (first 3 days only): That he or she is in a hospital   Medical Problem List and Plan: 1. 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 DVT Prophylaxis/Anticoagulation: Mechanical: Sequential compression devices, below knee Bilateral lower extremities 3. Pain Management: oxycodone 5mg  Q4H prn 4. Mood: emotional lability, psychosocial support, family ed  -schedule hs seroquel to assist with sleep-wake  -showing some improvement  -consider vpa if worsening 5. Neuropsych: This patient is not capable of making decisions on her own behalf.  -impulsive/attention poor---consider ritalin trial 6. Skin/Wound Care: monitor PICC site, no decubiti 7. Fluids/Electrolytes/Nutrition: encourage po 8.  ?Seizure taken off Keppra by NS, will monitor 9.  Pulmonary contusions as well as aspiration pneumonia. 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 Tachycardia with episode of A  flutter was on beta blocker as recommended by cardiology Dr Delton See will restart 12. Insomnia. seroquel  as above  -improve sleep "hygiene" 13. Right-sided hearing deficit---may be due to blood in canal  -clean ear first  -consider audiology eval given temporal bone fracture   ELOS (Days) 4 A FACE TO FACE EVALUATION WAS PERFORMED  SWARTZ,ZACHARY T 03/12/2016, 9:06 AM
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
## 2016-03-12 NOTE — Progress Notes (Signed)
Physical Therapy Session Note  Patient Details  Name: Theresa SprayHeather Henderson MRN: [id number] Date of Birth: 1991-03-16  Today's Date: 03/12/2016 PT Individual Time: [id number] PT Individual Time Calculation (min): 28 min    Short Term Goals: Week 1:  PT Short Term Goal 1 (Week 1): Pt will be able to gait with min assist in distracted environment PT Short Term Goal 2 (Week 1): Pt will be able to demonstrate dynamic standing balance during functional tasks with steadying assist PT Short Term Goal 3 (Week 1): Pt will be able to demonstrate sustained attention with mod assist during sessions  Skilled Therapeutic Interventions/Progress Updates:    Pt received in bed asleep but was awakened with verbal stimulation. Pt confused therapist with her cousin with therapist and mother re-educating pt. Pt did not want to return to gym but agreeable to helping PT find object (cart with balls in it) and pt able to independently recall cart was in gym. Pt unable to find path to gym from room and asked RN for directions. Pt then able to independently find cart without cuing. Pt required min A (shoulder over shoulder) for ambulation x 200 ft room<>gym due to decreased dynamic balance during gait. Pt returned to room & transferred back to supine in bed, reporting she was cold. Pt willing to sort cards. Pt required maximum cuing to match cards as she attempted to match different suits of same number together. Pt then sorted cards by color requiring moderate cuing to stay on task. At end of session pt left in bed with all needs within reach, mother present, and bed alarm set.  Therapy Documentation Precautions:  Precautions Precautions: Fall Precaution Comments: impulsive Restrictions Weight Bearing Restrictions: No   See Function Navigator for Current Functional Status.   Therapy/Group: Individual Therapy  Sandi MariscalVictoria M Missey Hasley 03/12/2016, 5:28 PM

## 2016-03-12 NOTE — Progress Notes (Signed)
Physical Therapy Session Note  Patient Details  Name: Theresa Henderson MRN: [id number] Date of Birth: 25-Dec-1990  Today's Date: 03/12/2016 PT Individual Time: 1315-1415 PT Individual Time Calculation (min): 60 min    Short Term Goals: Week 1:  PT Short Term Goal 1 (Week 1): Pt will be able to gait with min assist in distracted environment PT Short Term Goal 2 (Week 1): Pt will be able to demonstrate dynamic standing balance during functional tasks with steadying assist PT Short Term Goal 3 (Week 1): Pt will be able to demonstrate sustained attention with mod assist during sessions  Skilled Therapeutic Interventions/Progress Updates:    Session focused on BI education with family (pt's mother, uncle and aunt), functional transfers including toileting needs at supervision/steadying assist level, dynamic standing balance, gait without AD while performing dual task activities, endurance, floor transfer to get down to engage with therapy dog, safety, and cognition.   Pt visited with therapy dog and engaged in appropriate question and answering conservation with therapy dog owner and RT while pt reaching from couch with supervision as well as performed transfer down to floor (supervision down and min +2 to get up from floor) ~ 10 minutes. Once new family arrived during session and NT arrived for vitals, pt required max cues for redirection to task of "going to work out" with this PT. Educated family on recommendation for only one family member max at a time during therapy sessions and to keep visiting and therapies separate to help pt maintain focus on session with therapist.  Pt's mother was agreeable and uncle came and observed from a distance during this therapy session successfully.  Pt required overall very close supervision to min assist for mobility for transfers, gait, and stair negotiation. Pt started to run up and down the stairs and began perseverating on stairs repeating them x 4 reps in gym -  once able to sustain attention to the therapist, pt able to be redirected. Pt demonstrates poor short term memory during session when asked to locate the shopping cart with balls in it once we got to the gym requiring max cues by PT. Worked on pathfinding and functional memory to take objects out of shopping cart and place in various locations on unit and then go back and collect these items. Performed lateral stepping with squats as a "work out" as pt reported frequently that she would work out daily with her cousin. Pt enjoyed this activity. End of session returned back to bed due to headache and bed alarm on.    Therapy Documentation Precautions:  Precautions Precautions: Fall Precaution Comments: impulsive Restrictions Weight Bearing Restrictions: No General: PT Amount of Missed Time (min): 15 Minutes PT Missed Treatment Reason: Unavailable (Comment) (Meeting with social security/insurance) Pain: No complaints at start of session but at end of session c/o headache and RN notified.   See Function Navigator for Current Functional Status.   Therapy/Group: Individual Therapy  Karolee StampsGray, [name] Darrol PokeBrescia  [name] B. Avalene Sealy, PT, DPT  03/12/2016, 3:35 PM

## 2016-03-12 NOTE — Progress Notes (Signed)
[facility]patient [facility] Individual Statement of Services  Patient Name:  Theresa SprayHeather Henderson  Date:  03/12/2016  Welcome to the [facility]patient Rehabilitation [facility].  Our goal is to provide you with an individualized program based on your diagnosis and situation, designed to meet your specific needs.  With this comprehensive rehabilitation program, you will be expected to participate in at least 3 hours of rehabilitation therapies Monday-Friday, with modified therapy programming on the weekends.  Your rehabilitation program will include the following services:  Physical Therapy (PT), Occupational Therapy (OT), Speech Therapy (ST), 24 hour per day rehabilitation nursing, Neuropsychology, Case Management (Social Worker), Rehabilitation Medicine, Nutrition Services and Pharmacy Services  Weekly team conferences will be held on Tuesdays to discuss your progress.  Your Social Worker will talk with you frequently to get your input and to update you on team discussions.  Team conferences with you and your family in attendance may also be held.  Expected length of stay: 14 - 16 days  Overall anticipated outcome: Supervision  Depending on your progress and recovery, your program may change. Your Social Worker will coordinate services and will keep you informed of any changes. Your Social Worker's name and contact numbers are listed  below.  The following services may also be recommended but are not provided by the Inpatient Rehabilitation Center:   Driving Evaluations  Home Health Rehabiltiation Services  Outpatient Rehabilitation Services  Vocational Rehabilitation   Arrangements will be made to provide these services after discharge if needed.  Arrangements include referral to agencies that provide these services.  Your insurance has been verified to be: no insurance at this time - Boneta LucksJenny has asked Artistinancial Counselor to assess to find for what you may be eligible.  Your primary doctor is:  Boneta LucksJenny  will work to get you connected to the [facility]  Pertinent information will be shared with your doctor and your insurance company.  Social Worker:  Staci AcostaJenny Granite Godman, LCSW  [phone] or ([phone]) [phone]  Information discussed with and copy given to patient by: Elvera LennoxPrevatt, Zakk Borgen Capps, 03/12/2016, 9:32 AM

## 2016-03-12 NOTE — IPOC Note (Signed)
Overall Plan of Care [facility](IPOC) Patient Details Name: Theresa Henderson MRN: [id number] DOB: Jan 15, 1991  Admitting Diagnosis: TBI  Hospital Problems: Active Problems:   Mild TBI [facility](HCC)     Functional Problem List: Nursing Bowel, Endurance, Medication Management, Motor, Nutrition, Pain, Safety, Skin Integrity  PT Balance, Behavior, Endurance, Pain, Perception, Safety  OT Balance, Cognition, Endurance, Motor, Skin Integrity, Safety, Vision, Pain  SLP Behavior, Cognition, Safety, Linguistic  TR         Basic ADL's: OT Eating, Grooming, Dressing, Toileting, Bathing     Advanced  ADL's: OT Simple Meal Preparation     Transfers: PT Bed Mobility, Bed to Chair, Car, Occupational psychologisturniture  OT Toilet, Research scientist (life sciences)Tub/Shower     Locomotion: PT Ambulation, Stairs     Additional Impairments: OT None  SLP Swallowing, Social Cognition   Social Interaction, Problem Solving, Memory, Attention, Awareness  TR      Anticipated Outcomes Item Anticipated Outcome  Self Feeding N/A  Swallowing  supervision for least restrictive diet   Basic self-care  Supervision  Toileting  Supervision   Bathroom Transfers Supervision  Bowel/Bladder  Min assist  Transfers  supervision overall  Locomotion  supervision overall for gait and stairs  Communication  mod I for basic  Cognition  supervision for basic, min A for complex  Pain  < 4  Safety/Judgment  Supervision   Therapy Plan: PT Intensity: Minimum of 1-2 x/day ,45 to 90 minutes PT Frequency: 5 out of 7 days PT Duration Estimated Length of Stay: 15-20 days OT Intensity: Minimum of 1-2 x/day, 45 to 90 minutes OT Frequency: 5 out of 7 days OT Duration/Estimated Length of Stay: 20-23 days SLP Intensity: Minumum of 1-2 x/day, 30 to 90 minutes SLP Frequency: 3 to 5 out of 7 days SLP Duration/Estimated Length of Stay: 14-18 days       Team Interventions: Nursing Interventions Patient/Family Education, Bowel Management, Medication Management, Pain Management,  Cognitive Remediation/Compensation, Dysphagia/Aspiration Precaution Training, Psychosocial Support, Discharge Planning, Skin Care/Wound Management  PT interventions Ambulation/gait training, Balance/vestibular training, Cognitive remediation/compensation, Community reintegration, Discharge planning, Disease management/prevention, DME/adaptive equipment instruction, Functional mobility training, Neuromuscular re-education, Pain management, Patient/family education, Psychosocial support, Stair training, Therapeutic Activities, Therapeutic Exercise, UE/LE Strength taining/ROM, UE/LE Coordination activities, Visual/perceptual remediation/compensation, Wheelchair propulsion/positioning  OT Interventions Warden/rangerBalance/vestibular training, Patient/family education, Therapeutic Activities, Wheelchair propulsion/positioning, Therapeutic Exercise, Cognitive remediation/compensation, Community reintegration, Self Care/advanced ADL retraining, UE/LE Strength taining/ROM, Functional mobility training, Discharge planning, Neuromuscular re-education, Skin care/wound managment, UE/LE Coordination activities, Pain management, Visual/perceptual remediation/compensation, Psychosocial support, DME/adaptive equipment instruction  SLP Interventions Cognitive remediation/compensation, Cueing hierarchy, Environmental controls, Therapeutic Activities, Multimodal communication approach, Patient/family education, Functional tasks  TR Interventions    SW/CM Interventions Discharge Planning, Psychosocial Support, Patient/Family Education    Team Discharge Planning: Destination: PT-Home ,OT- Home , SLP-Home Projected Follow-up: PT-Outpatient PT, 24 hour supervision/assistance, OT-  Outpatient OT, SLP-24 hour supervision/assistance, Outpatient SLP Projected Equipment Needs: PT-To be determined, OT- To be determined, SLP-None recommended by SLP Equipment Details: PT- , OT-  Patient/family involved in discharge planning: PT- Patient  unable/family or caregiver not available,  OT-Patient, Family member/caregiver, SLP-Patient, Family member/caregiver  MD ELOS: 15-20 days Medical Rehab Prognosis:  Excellent Assessment: The patient has been admitted for CIR therapies with the diagnosis of TBI/trauma. The team will be addressing functional mobility, strength, stamina, balance, safety, adaptive techniques and equipment, self-care, bowel and bladder mgt, patient and caregiver education, NMR, cognitive-perceptual awareness, pain control, behavior mgt, intensive BI education for family, restoration of sleep wake, . Goals have been  set at supervision for mobility and self-care (occ min assist?). Supervision for cognition/comminication, min assist for more complex tasks    Theresa OysterZachary T. Swartz, MD, [facility]AP[facility]      See Team Conference Notes for weekly updates to the plan of care

## 2016-03-12 NOTE — Progress Notes (Signed)
Speech Language Pathology Daily Session Note  Patient Details  Name: Theresa Henderson MRN: [id number] Date of Birth: 1991/01/11  Today's Date: 03/12/2016 SLP Individual Time: [id number] SLP Individual Time Calculation (min): 35 min   Short Term Goals: Week 1: SLP Short Term Goal 1 (Week 1): Pt to answer simple y/n questions with min A with 100% acc. SLP Short Term Goal 2 (Week 1): Pt to demonstrate sustained attention to functional task with mod A. SLP Short Term Goal 3 (Week 1): Pt to demonstrate reading comprehension at the complex sentence- short paragraph level with min A.  SLP Short Term Goal 4 (Week 1): Pt to tolerate trials of thin liquids with min A for use of compensatory strategies without overt s/s aspiration.  SLP Short Term Goal 5 (Week 1): Pt demonstrate O x4 with min A.   Skilled Therapeutic Interventions: Skilled treatment session focused on cognitive and dysphagia goals. SLP facilitated session by initially providing Mod A verbal cues for patient to initiate lunch meal due to perseveration on making the bed. Once patient was redirected, patient was able to sustain attention to self-feeding for ~20 minutes with Min A verbal cues for redirection.  Patient consumed lunch meal of Dys. 3 textures with nectar-thick liquids without overt s/s of aspiration and required Max-Total A verbal and tactile cues for use of small bites/sips. Recommend patient continue current diet with full supervision from staff due to impulsivity. Patient left upright in chair with family present. Continue with current plan of care.   Function:  Eating Eating   Modified Consistency Diet: Yes Eating Assist Level: Set up assist for;Supervision or verbal cues;Help managing cup/glass   Eating Set Up Assist For: Opening containers;Cutting food       Cognition Comprehension Comprehension assist level: Understands basic 50 - 74% of the time/ requires cueing 25 - 49% of the time  Expression   Expression assist  level: Expresses basic 50 - 74% of the time/requires cueing 25 - 49% of the time. Needs to repeat parts of sentences.  Social Interaction Social Interaction assist level: Interacts appropriately 25 - 49% of time - Needs frequent redirection.  Problem Solving Problem solving assist level: Solves basic 25 - 49% of the time - needs direction more than half the time to initiate, plan or complete simple activities  Memory Memory assist level: Recognizes or recalls less than 25% of the time/requires cueing greater than 75% of the time    Pain No/Denies Pain   Therapy/Group: Individual Therapy  Lenea Bywater 03/12/2016, 3:03 PM

## 2016-03-12 NOTE — Progress Notes (Signed)
Recreational Therapy Session Note  Patient Details  Name: Theresa Henderson MRN: [id number] Date of Birth: 1991-07-28 Today's Date: 03/12/2016  Pain: no c/o Skilled Therapeutic Interventions/Progress Updates: Met with pt & mom briefly today as pt participated in animal assisted therapy seated EOC and from floor with supervision.  Pt conversing with LRT & Pet Partner Team asking appropriate questions and comments in reference to therapy dog, training, behavior, etc.  Pt performed floor transfer with supervision to get down onto the floor and min assist +2 to get up with pt requesting help from both staff members to get up.  Mom present and appreciative of visit. Ermias Tomeo 03/12/2016, 4:23 PM

## 2016-03-13 ENCOUNTER — Encounter (HOSPITAL_COMMUNITY): Payer: Self-pay

## 2016-03-13 ENCOUNTER — Inpatient Hospital Stay (HOSPITAL_COMMUNITY): Payer: Medicaid Other | Admitting: Physical Therapy

## 2016-03-13 ENCOUNTER — Inpatient Hospital Stay (HOSPITAL_COMMUNITY): Payer: Medicaid Other | Admitting: Speech Pathology

## 2016-03-13 ENCOUNTER — Inpatient Hospital Stay (HOSPITAL_COMMUNITY): Payer: Medicaid Other | Admitting: *Deleted

## 2016-03-13 ENCOUNTER — Inpatient Hospital Stay (HOSPITAL_COMMUNITY): Payer: Medicaid Other | Admitting: Occupational Therapy

## 2016-03-13 DIAGNOSIS — R519 Headache, unspecified: Secondary | ICD-10-CM

## 2016-03-13 DIAGNOSIS — S069X2S Unspecified intracranial injury with loss of consciousness of 31 minutes to 59 minutes, sequela: Secondary | ICD-10-CM

## 2016-03-13 DIAGNOSIS — R42 Dizziness and giddiness: Secondary | ICD-10-CM

## 2016-03-13 DIAGNOSIS — F09 Unspecified mental disorder due to known physiological condition: Secondary | ICD-10-CM

## 2016-03-13 DIAGNOSIS — F418 Other specified anxiety disorders: Secondary | ICD-10-CM

## 2016-03-13 DIAGNOSIS — R51 Headache: Secondary | ICD-10-CM

## 2016-03-13 NOTE — Progress Notes (Signed)
Physical Therapy Session Note  Patient Details  Name: Theresa SprayHeather Henderson MRN: [id number] Date of Birth: May 03, 1991  Today's Date: 03/13/2016 PT Individual Time: 1400-1430 PT Individual Time Calculation (min): 30 min    Short Term Goals: Week 1:  PT Short Term Goal 1 (Week 1): Pt will be able to gait with min assist in distracted environment PT Short Term Goal 2 (Week 1): Pt will be able to demonstrate dynamic standing balance during functional tasks with steadying assist PT Short Term Goal 3 (Week 1): Pt will be able to demonstrate sustained attention with mod assist during sessions  Skilled Therapeutic Interventions/Progress Updates:  Pt received in bed; still c/o headache but willing to participate in therapy in the room.  Pt participated in seated activities to continue to focus on cognition, attention, memory, and sequencing.  Pt performed 2 games of speed matching game, "Spot It" after therapist provided mod verbal cues to explain rules of each game and performed whole game with 80% accuracy.  Transitioned to looking through magazines for food section and locating a recipe she would like to make while on CIR.  Pt located a tomato basil pizza; will check with SLP if recipe would be safe for pt. And will plan meal prep session with recreational therapist.  Pt left in bed with all items within reach.     Therapy Documentation Precautions:  Precautions Precautions: Fall Precaution Comments: impulsive Restrictions Weight Bearing Restrictions: No Pain:  Pt reporting headache; premedicated   See Function Navigator for Current Functional Status.   Therapy/Group: Individual Therapy  Edman CircleHall, Kevin Space [facility] 03/13/2016, 2:49 PM

## 2016-03-13 NOTE — Progress Notes (Signed)
Physical Therapy Session Note  Patient Details  Name: Theresa Henderson MRN: [id number] Date of Birth: 04-Apr-1991  Today's Date: 03/13/2016 PT Individual Time: [id number] PT Individual Time Calculation (min): 50 min    Short Term Goals: Week 1:  PT Short Term Goal 1 (Week 1): Pt will be able to gait with min assist in distracted environment PT Short Term Goal 2 (Week 1): Pt will be able to demonstrate dynamic standing balance during functional tasks with steadying assist PT Short Term Goal 3 (Week 1): Pt will be able to demonstrate sustained attention with mod assist during sessions  Skilled Therapeutic Interventions/Progress Updates:    Patient asleep upon arrival, easily aroused to voice. Patient requesting to stay in room because she already changed into pajamas. Patient perseverative throughout session on not wearing Henderson bra (declined to don for session) and her "stomach wanting to vomit." Patient declined requesting anti-nausea medication and attempting to toilet. Instructed in [place] to address attention, command following, and standing balance. Patient demonstrates moderate fall risk as noted by score of 48/56 on Berg Balance Scale, greatly improved from 11/56 on evaluation (4 days ago). To challenge attention and balance with cognitive dual task, standing on foam pad with close supervision and 2 mild LOB patient instructed in naming different foods without repeating any items while bouncing ball to self. Patient rapidly completing task and perseverating on same foods despite total Henderson cues. Transitioned to naming food based on letter of alphabet to minimize perseveration but patient instead naming objects or making up words instead of naming food and unwilling to complete task as instructed as well as denied that named foods were made up/unedible (cable, kigama, igloo, etc). Transitioned to counting up by 3's to 60 and back down by 2's with ball catch/toss with mod subtle/questioning cues  for error recognition. Patient provided supine rest breaks as needed due to upset stomach and at end of session, patient refused further therapies and declined to return to sitting. Patient left supine in bed, mother notified of session terminated early. Patient with less c/o dizziness with supine <> sit transitions this PM compared to AM session.   Therapy Documentation Precautions:  Precautions Precautions: Fall Precaution Comments: impulsive Restrictions Weight Bearing Restrictions: No General: PT Amount of Missed Time (min): 10 Minutes PT Missed Treatment Reason: Patient ill (Comment);Patient unwilling to participate (nauseated) Pain:  C/o stomach feeling sick, unrated, RN made aware Balance: Balance Balance Assessed: Yes Standardized Balance Assessment Standardized Balance Assessment: Berg Balance Test Berg Balance Test Sit to Stand: Able to stand  independently using hands Standing Unsupported: Able to stand safely 2 minutes Sitting with Back Unsupported but Feet Supported on Floor or Stool: Able to sit safely and securely 2 minutes Stand to Sit: Sits safely with minimal use of hands Transfers: Able to transfer safely, definite need of hands Standing Unsupported with Eyes Closed: Able to stand 10 seconds with supervision Standing Ubsupported with Feet Together: Able to place feet together independently and stand for 1 minute with supervision From Standing, Reach Forward with Outstretched Arm: Can reach confidently >25 cm (10") From Standing Position, Pick up Object from Floor: Able to pick up shoe safely and easily From Standing Position, Turn to Look Behind Over each Shoulder: Looks behind from both sides and weight shifts well Turn 360 Degrees: Able to turn 360 degrees safely in 4 seconds or less Standing Unsupported, Alternately Place Feet on Step/Stool: Able to stand independently and safely and complete 8 steps in 20 seconds Standing  Unsupported, One Foot in Front: Able to  plae foot ahead of the other independently and hold 30 seconds Standing on One Leg: Tries to lift leg/unable to hold 3 seconds but remains standing independently Total Score: 48/56  See Function Navigator for Current Functional Status.   Therapy/Group: Individual Therapy  Kerney ElbeVarner, Theresa Henderson 03/13/2016, 3:42 PM

## 2016-03-13 NOTE — Progress Notes (Signed)
Occupational Therapy Session Note  Patient Details  Name: Theresa Henderson MRN: [id number] Date of Birth: 11/16/1990  Today's Date: 03/13/2016 OT Individual Time: 0830-1000 OT Individual Time Calculation (min): 90 min     Short Term Goals:Week 1:  OT Short Term Goal 1 (Week 1): Pt will complete grooming standing at sink level with min assistance for dynamic balance. OT Short Term Goal 2 (Week 1): Pt will tolerate 30 minutes of therapeutic intervention with no emotional outbursts, as appropriate. OT Short Term Goal 3 (Week 1): Pt will don socks and shoes with supervision for increase in LB dressing independence. OT Short Term Goal 4 (Week 1): Pt will attend to 3 minutes of cognitive task with zero need for redirection.  Skilled Therapeutic Interventions/Progress Updates:    Pt seen for skilled OT to facilitate cognitive skills of attention, sequencing, memory and dynamic standing balance.  Pt in bed eating breakfast with mother in the room. Multiple cues to take smaller bites.  Ambulated to bathroom to toilet/ shower with close S.  Improved standing balance at a S level in shower with cues to sit down to wash/ dry feet. Mod cues with sequencing and safety awareness as pt has water at an extremely hot level.  Pt with varied lability, at one point wanted to be alone. Sat in shower (after fully dressed) for a few minutes to cry by herself. She then stated she was ready. Spoke with pt and mom about allowing time for pt to process questions/ thoughts before mom answering for her or moving onto a new topic.  Pt very concerned if she was doing well. Reviewed progress noted from admission. Pt ambulated to gym with steady A. In gym worked on various dynamic balance (at parallel bars) and core control exercises (on mat). (bridges, single leg lifts, quadriped, knee drops, etc). Max cues for body positioning. Pt needed visual cues as verbal cues were harder for her to follow. Pt sat up from supine with extreme  dizziness. layed down on mat to relax with max cues for slow deep breathing as she was breathing at a very rapid rate.  Pt relaxed on mat until next therapists ( PT and TR) arrived.   Therapy Documentation Precautions:  Precautions Precautions: Fall Precaution Comments: impulsive Restrictions Weight Bearing Restrictions: No   Pain: Pain Assessment Pain Assessment: 0-10 Pain Score: 9  Pain Type: Acute pain Pain Location: Head Pain Descriptors / Indicators: Headache Pain Onset: Gradual Pain Intervention(s): RN made aware ADL: ADL Where Assessed-Upper Body Bathing:  (sitting on tub bench) ADL Comments: see functional navigator   See Function Navigator for Current Functional Status.   Therapy/Group: Individual Therapy  SAGUIER,JULIA 03/13/2016, 10:32 AM

## 2016-03-13 NOTE — Progress Notes (Addendum)
Speech Language Pathology Daily Session Note  Patient Details  Name: Theresa Henderson MRN: [id number] Date of Birth: [birth year]  Today's Date: 03/13/2016 SLP Individual Time: 1300-1400 SLP Individual Time Calculation (min): 60 min   Short Term Goals: Week 1: SLP Short Term Goal 1 (Week 1): Pt to answer simple y/n questions with min A with 100% acc. SLP Short Term Goal 2 (Week 1): Pt to demonstrate sustained attention to functional task with mod A. SLP Short Term Goal 3 (Week 1): Pt to demonstrate reading comprehension at the complex sentence- short paragraph level with min A.  SLP Short Term Goal 4 (Week 1): Pt to tolerate trials of thin liquids with min A for use of compensatory strategies without overt s/s aspiration.  SLP Short Term Goal 5 (Week 1): Pt demonstrate O x4 with min A.   Skilled Therapeutic Interventions: Skilled treatment session focused on cognitive goals. Upon arrival, patient was finishing her lunch meal of Dys. 3 textures with nectar-thick liquids and required Mod A verbal cues for use of small bites/sips. Patient was independently oriented to situation and required supervision verbal cues for orientation to date and total A for orientation place in regards to name of the hospital. Patient was verbally perseverative, tangential and intermittently verbally inappropriate. However, these behaviors were deceased when patient focused on structured table tasks. Patient performed basic money management tasks with Min A verbal and visual cues and demonstrated intellectual awareness of current therapy disciplines and goals with Mod-Max A question and verbal cues. Patient also performed basic reading comprehension tasks at the word, sentence and simple paragraph level with 100% accuracy with Mod-Max A verbal and question cues due to impulsivity. Patient was also labile throughout session. Patient left supine in bed with alarm on and all needs within reach. Continue with current plan of care.    Function:  Cognition Comprehension Comprehension assist level: Understands basic 50 - 74% of the time/ requires cueing 25 - 49% of the time  Expression   Expression assist level: Expresses basic 50 - 74% of the time/requires cueing 25 - 49% of the time. Needs to repeat parts of sentences.  Social Interaction Social Interaction assist level: Interacts appropriately 25 - 49% of time - Needs frequent redirection.  Problem Solving Problem solving assist level: Solves basic 25 - 49% of the time - needs direction more than half the time to initiate, plan or complete simple activities  Memory Memory assist level: Recognizes or recalls less than 25% of the time/requires cueing greater than 75% of the time    Pain No/Denies Pain   Therapy/Group: Individual Therapy  Amenah Tucci 03/13/2016, 3:09 PM
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
## 2016-03-13 NOTE — Plan of Care (Signed)
Problem: RH COGNITION-NURSING Goal: RH STG USES MEMORY AIDS/STRATEGIES W/ASSIST TO PROBLEM SOLVE STG Uses Memory Aids/Strategies With min Assistance to Problem Solve.  Outcome: Not Progressing Mod assist Goal: RH STG ANTICIPATES NEEDS/CALLS FOR ASSIST W/ASSIST/CUES STG Anticipates Needs/Calls for Assist With min Assistance/Cues.  Outcome: Not Progressing Max cues   Problem: RH KNOWLEDGE DEFICIT BRAIN INJURY Goal: RH STG INCREASE KNOWLEDGE OF DYSPHAGIA/FLUID INTAKE Min assist  Outcome: Not Progressing Mod assist
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
## 2016-03-14 ENCOUNTER — Inpatient Hospital Stay (HOSPITAL_COMMUNITY): Payer: Self-pay | Admitting: Physical Therapy

## 2016-03-14 ENCOUNTER — Inpatient Hospital Stay (HOSPITAL_COMMUNITY): Payer: Medicaid Other | Admitting: Speech Pathology

## 2016-03-14 ENCOUNTER — Inpatient Hospital Stay (HOSPITAL_COMMUNITY): Payer: Medicaid Other | Admitting: Physical Therapy

## 2016-03-14 ENCOUNTER — Inpatient Hospital Stay (HOSPITAL_COMMUNITY): Payer: Medicaid Other | Admitting: Occupational Therapy

## 2016-03-14 DIAGNOSIS — F09 Unspecified mental disorder due to known physiological condition: Secondary | ICD-10-CM

## 2016-03-14 DIAGNOSIS — H9191 Unspecified hearing loss, right ear: Secondary | ICD-10-CM

## 2016-03-14 MED ORDER — METHYLPHENIDATE HCL 5 MG PO TABS
5.0000 mg | ORAL_TABLET | Freq: Two times a day (BID) | ORAL | Status: DC
  Administered 2016-03-14 – 2016-03-15 (×2): 5 mg via ORAL
  Filled 2016-03-14 (×2): qty 1

## 2016-03-14 NOTE — Progress Notes (Signed)
Physical Therapy Session Note  Patient Details  Name: Theresa Henderson MRN: [id number] Date of Birth: 1990-09-10  Today's Date: 03/14/2016 PT Individual Time: 1030-1130 PT Individual Time Calculation (min): 60 min    Short Term Goals: Week 1:  PT Short Term Goal 1 (Week 1): Pt will be able to gait with min assist in distracted environment PT Short Term Goal 2 (Week 1): Pt will be able to demonstrate dynamic standing balance during functional tasks with steadying assist PT Short Term Goal 3 (Week 1): Pt will be able to demonstrate sustained attention with mod assist during sessions  Skilled Therapeutic Interventions/Progress Updates:    Session focused on cognition, functional ambulation, and standing balance. Patient asleep upon arrival, requiring increased time and encouragement to participate in therapy and perseverating on being cold and her shirt being too revealing. Patient required encouragement to complete basic self-care tasks independently as she continued to ask for unnecessary assistance from therapist. Patient donned shirt and shoes seated EOB and brushed hair with max cues for attention to task. Patient perseverative on drinking thin liquid with SLP this AM, reinforced current swallow precautions to be followed until upgraded by SLP. Patient consumed nectar thick liquid with supervision and no cues to adhere to precautions. Patient followed basic 1 and 2-step commands with min-mod A multimodal cues. In quiet ADL apartment, patient attended to task of completing 4 pipetree puzzles of varying difficulty with min cues for problem solving. Patient engaged in 2 rounds of Just Dance Wii for dynamic balance retraining with supervision and no cues for attention to task. Patient ambulated in room and throughout rehab unit without device up to 200 ft with supervision and no LOB. Patient left sitting EOB with SLP present.   Therapy Documentation Precautions:  Precautions Precautions:  Fall Precaution Comments: impulsive Restrictions Weight Bearing Restrictions: No Pain: Pain Assessment Pain Assessment: No/denies pain   See Function Navigator for Current Functional Status.   Therapy/Group: Individual Therapy  Kerney ElbeVarner, Braelen Sproule A 03/14/2016, 12:41 PM
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
## 2016-03-14 NOTE — Progress Notes (Signed)
Physical Therapy Session Note and Vestibular Assessment  Patient Details  Name: Theresa Henderson MRN: [id number] Date of Birth: 1991/04/08  Today's Date: 03/14/2016 PT Individual Time: 1300-1410 PT Individual Time Calculation (min): 70 min    Short Term Goals: Week 1:  PT Short Term Goal 1 (Week 1): Pt will be able to gait with min assist in distracted environment PT Short Term Goal 2 (Week 1): Pt will be able to demonstrate dynamic standing balance during functional tasks with steadying assist PT Short Term Goal 3 (Week 1): Pt will be able to demonstrate sustained attention with mod assist during sessions  Skilled Therapeutic Interventions/Progress Updates: Pt received in bed wishing to speak to her RN; RN unavailable but pt willing to participate in therapy.  Pt requesting to use bathroom.  Pt performed supine > sit and ambulated to bathroom with supervision.  Performed all toileting tasks and hand hygiene at sink with supervision with no reports of dizziness.  Pt ambulated to day room where she participated in vestibular assessment, see below.  During assessment pt requested to end session due to nausea.  Returned to room and to bed with supervision.  Pt left in bed with all items within reach.   1. History and Physical Examination    Pt reports acute episodes of dizziness s/p "waking up in the hospital."  Pt describes two types of dizziness: feeling "off balance" when up ambulating and room spinning dizziness with supine <> sit.  She reports them both as intermittent and of short duration.  She reports some changes in hearing in R ear, "it is quieter," constant headache and neck pain, and nausea but denies changes in vision or vomiting.    2. Vestibular Assessment                           Gross neck ROM Holds head in L lateral flexion and rotation, limited rotation to R side  Eye Alignment WFL, L ptosis  Oculomotor ROM WFL  Spontaneous  Nystagmus (room light and vision occluded) None  observed  Gaze holding nystagmus(room light and vision occluded) None observed in room light  Smooth pursuit Saccadic intrusions-L eye > R eye  Saccades Unable to follow cues  Vergence Impaired  VOR Cancellation N/T  Pressure Tests (vision occluded) Negative  VOR slow Negative but reported neck pain  Head Thrust Test + to L  Head Shaking Test (vision occluded) N/T  Dynamic Visual Acuity         N/T  Rt. Hallpike Dix Negative for vertigo  Lt. Hallpike Dix Negative for vertigo but nystagmus noted-unable to determine direction  Rt. Roll Test negative  Lt. Roll Test  negative  MSQ Supine <> sit 10/10 vertigo with pure L beating nystagmus  Cover-Cross Cover (if indicated) WFL  Head-Neck Differentiation Test (if indicated) WFL   3. Assessment of Gait and Balance(static and dynamic): Gait with head turns vertical and horizontal-pt demonstrated increased lateral LOB with R and L head turns but did report some dizziness with looking up.    4. Findings:  Patient signs and symptoms consistent with suspected BPPV of an undetermined canal vs. motion sensitivity vs. Cervico-genic dizziness and impaired visual-vestibular interactions.    5. Recommendations for Treatment: Undetermined at this time, recommending f/u session to rule out other causes of dizziness.   Therapy Documentation Precautions:  Precautions Precautions: Fall Precaution Comments: impulsive Restrictions Weight Bearing Restrictions: No Vital Signs: Therapy Vitals Temp: 98.6 F (37  C) Temp Source: Oral Pulse Rate: 85 Resp: 20 BP: 124/76 Patient Position (if appropriate): Lying Oxygen Therapy SpO2: 100 % O2 Device: Not Delivered Pain: C/o headache-premedicated     See Function Navigator for Current Functional Status.   Therapy/Group: Individual Therapy  Edman CircleHall, Octavia Mottola [facility] 03/14/2016, 3:56 PM

## 2016-03-14 NOTE — Progress Notes (Signed)
Patient noted up in hallway with staff this am. Patient very motivated and cheerful. Patient "hugging" staff for caring for her. Patient continues to require multiple cues for redirections. Patient complains of headache and reports headache decreases with oxycodone 5 mg po. Patient noted sleeping 1-2 hours after oxycodone given. Patient'Henderson mother did not spend day with patient and patient pacing in room perseverating on cleaning room. Patient reports she is upset due to her mother not returning to clarify financial questions patient had concerning her accounts at her bank. Patient noted to be quiet and decreased communication with staff.Patient refused to eat and only drinking strawberry ensures. Patient became very upset and crying when RN asked why she was crying. Patient reported she did not like the audiology exam and wanted a staff member or someone present if we want her to participate in further procedures. Minimal bleeding noted from right ear.  Provided emotional support to patient and mother returned after 4pm. Patient expressed being angry at her mother and patient noted to be better after allowing her to talk with her mother to clear issues concerning her finances. Patient'Henderson uncle and aunt in to visit and patient very happy and in better mood. Continue to monitor mood swings. Patient now on ritalin. Wander guard remains intact due to patient getting up without assistance. Bed alarm is ineffective for patient to identify the alarm is for her to wait for staff to assist her out of bed. Patient becoming mobile with gait more steady. Continue with plan of care. Theresa Henderson, Theresa Henderson

## 2016-03-14 NOTE — Progress Notes (Signed)
Speech Language Pathology Daily Session Notes  Patient Details  Name: Theresa SprayHeather Saxer MRN: [id number] Date of Birth: 08/28/1990  Today's Date: 03/14/2016  Session 1: SLP Individual Time: [id number] SLP Individual Time Calculation (min): 65 min   Session 2: SLP Individual Time: 1130-1140 SLP Individual Time Calculation (min): 10 min  Missed Time: 20 minutes due to fatigue      Short Term Goals: Week 1: SLP Short Term Goal 1 (Week 1): Pt to answer simple y/n questions with min A with 100% acc. SLP Short Term Goal 2 (Week 1): Pt to demonstrate sustained attention to functional task with mod A. SLP Short Term Goal 3 (Week 1): Pt to demonstrate reading comprehension at the complex sentence- short paragraph level with min A.  SLP Short Term Goal 4 (Week 1): Pt to tolerate trials of thin liquids with min A for use of compensatory strategies without overt s/s aspiration.  SLP Short Term Goal 5 (Week 1): Pt demonstrate O x4 with min A.   Skilled Therapeutic Interventions:  Session 1: Skilled treatment session focused on cognitive and dysphagia goals. Upon arrival, patient was up while ambulating around the room with NT while perseverating on "straightening up the room" and removing the bed alarm "so she can walk around without help" because she felt "so good today." Patient was hyperverbal, impulsive and perseverative throughout the session that required Max A multimodal cues for redirection. Patient consumed trials of thin liquids via cup without overt s/s of aspiration and consumed small, controlled sips with Min A verbal cues. Patient also consumed thin liquids with limited amounts of Dys. 3 textures without overt s/s of aspiration. However, recommend continued trials with solid textures prior to upgrade due to impulsivity. Patient left upright in chair with family present. Continue with current plan of care.   Session 2: Skilled treatment session focused on cognitive and dysphagia goals. Upon  arrival, patient returning from PT and appeared lethargic with decreased verbosity. Patient sat upright in bed and declined lunch tray of Dys. 3 textures or any other snack despite Max encouargement. Patient consumed limited amounts of thin liquids via cup without overt s/s of aspiration with Max encouragement. Patient returned to supine in bed and requested for the door to be closed. Patient missed remaining 20 minutes of session due to fatigue and suspected overstimulation.   Function:  Eating Eating   Modified Consistency Diet: Yes Eating Assist Level: Set up assist for;Supervision or verbal cues   Eating Set Up Assist For: Opening containers       Cognition Comprehension Comprehension assist level: Understands basic 50 - 74% of the time/ requires cueing 25 - 49% of the time  Expression   Expression assist level: Expresses basic 50 - 74% of the time/requires cueing 25 - 49% of the time. Needs to repeat parts of sentences.  Social Interaction Social Interaction assist level: Interacts appropriately 25 - 49% of time - Needs frequent redirection.  Problem Solving Problem solving assist level: Solves basic 25 - 49% of the time - needs direction more than half the time to initiate, plan or complete simple activities  Memory Memory assist level: Recognizes or recalls less than 25% of the time/requires cueing greater than 75% of the time    Pain Pain Assessment Pain Assessment: No/denies pain  Therapy/Group: Individual Therapy  Thinh Cuccaro 03/14/2016, 2:20 PM

## 2016-03-14 NOTE — Procedures (Signed)
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  When testing the right ear with bone conduction, [name] was not able to tolerate masking in the left ear above 75dBHL, creating a masking dilemma (not enough masking) so cannot rule out the sounds crossing over to the left ear). Therefore, more sensorineural loss may be present in the right ear than these results indicate.  Tympanometry indicated shallow eardrum mobility in the right ear, however this could be due to other factors such as wax or blood in the ear canal.  Left ear results showed hearing within normal limits and normal middle ear function.  Patient/Family Education:  The patient agreed to testing per MD order.  The test results and recommendations were explained to patient and nurse.  Recommendations:  ENT assessment and complete audiological evaluation as an outpatient.  If you have any questions, please call [phone].  Mikhia Dusek A. Earlene Plateravis, Au.D., [facility]CCC Doctor of Audiology 03/14/2016 3:45 PM

## 2016-03-14 NOTE — Progress Notes (Signed)
Occupational Therapy Session Note  Patient Details  Name: Theresa SprayHeather Henderson MRN: [id number] Date of Birth: 03/23/1991  Today's Date: 03/14/2016 OT Individual Time: [id number] OT Individual Time Calculation (min): 45 min     Short Term Goals: Week 1:  OT Short Term Goal 1 (Week 1): Pt will complete grooming standing at sink level with min assistance for dynamic balance. OT Short Term Goal 2 (Week 1): Pt will tolerate 30 minutes of therapeutic intervention with no emotional outbursts, as appropriate. OT Short Term Goal 3 (Week 1): Pt will don socks and shoes with supervision for increase in LB dressing independence. OT Short Term Goal 4 (Week 1): Pt will attend to 3 minutes of cognitive task with zero need for redirection.  Skilled Therapeutic Interventions/Progress Updates:    Upon entering the room, pt seated in chair with mother present in room who was attempting to comb out matted hair. Pt perseverating on getting mats outs of hair throughout session requiring mod cues to redirect to another task. Pt ambulated in room with close supervision and no use of AE to obtain items needed for bathing and correctly placed them in location with increased time. Pt performed toileting from standard height toilet with steady assistance for balance during LB clothing management. Pt attempting to stand and step out of clothing vs. Sitting to remove. Pt engaged in bathing task from shower seat and remained seated throughout task. Pt leaning side to side in order to wash buttocks and peri area. Pt also very emotional and quietly crying during shower and states, "I don't want to talk about it." Pt returning to sit on EOB to don clothing items and needing steady assist for balance during clothing management. After dressing, pt declined therapy further and returns to bed. Bed alarm activated and call bell within reach upon exiting the room.   Therapy Documentation Precautions:  Precautions Precautions:  Fall Precaution Comments: impulsive Restrictions Weight Bearing Restrictions: No ADL: ADL Where Assessed-Upper Body Bathing:  (sitting on tub bench) ADL Comments: see functional navigator Exercises:   Other Treatments:    See Function Navigator for Current Functional Status.   Therapy/Group: Individual Therapy  Lowella Gripittman, Jhovani Griswold L 03/14/2016, 10:24 AM

## 2016-03-14 NOTE — Progress Notes (Signed)
Subjective/Complaints: Slept well. Denies pain. Ready to wash hair, fixated on matted area of hair. Still having difficulties hearing through right ear.   Review systems: Denies shortness of breath, nausea, vomiting, diarrhea or constipation. Limited by cognition, poor attention and perseveration  Objective: Vital Signs: Blood pressure 116/68, pulse 81, temperature 98.4 F (36.9 C), temperature source Oral, resp. rate 16, height 6\' 2"  (1.88 m), weight 94.9 kg (209 lb 3.5 oz), last menstrual period 02/29/2016, SpO2 99 %. No results found. No results found for this or any previous visit (from the past 72 hour(s)).    General: No acute distress Mood and affect are appropriate Heart: Regular rate and rhythm no rubs murmurs or extra sounds Lungs: Clear to auscultation, breathing unlabored, no rales or wheezes Abdomen: Positive bowel sounds, soft nontender to palpation, nondistended Extremities: No clubbing, cyanosis, or edema Skin: No evidence of breakdown, no evidence of rash, ecchymosis behind the right ear Neurologic: Cranial nerves II through XII intact except for hearing right ear (dried blood however in ext canal).  motor strength is 5/5 in bilateral deltoid, bicep, tricep, grip, hip flexor, knee extensors, ankle dorsiflexor and plantar flexor Sensory exam normal sensation to light touch and proprioception in bilateral upper and lower extremities Cerebellar exam normal finger to nose to finger as well as heel to shin in bilateral upper and lower extremities Pt impulsive, easily distracted/attention waxes and wanes. Non-irritable, cooperative. Needs frequent redirection. Can perseverate at times Musculoskeletal: Full range of motion in all 4 extremities. No joint swelling   Assessment/Plan: 1. 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 Physiatrist is providing close team supervision and 24 hour management of active medical problems listed below. Physiatrist and rehab team continue to assess barriers to discharge/monitor patient progress toward functional and medical goals. FIM: Function - Bathing Position: Shower Body parts bathed by patient: Right arm, Left arm, Chest, Abdomen, Front perineal area, Buttocks, Right upper leg, Right lower leg, Left upper leg, Left lower leg, Back Body parts bathed by helper: Back Bathing not applicable: Back Assist Level: Supervision or verbal cues  Function- Upper Body Dressing/Undressing What is the patient wearing?: Bra, Pull over shirt/dress Bra - Perfomed by patient: Thread/unthread right bra strap, Thread/unthread left bra strap Bra - Perfomed by helper: Hook/unhook bra (pull down sports bra) Pull over shirt/dress - Perfomed by patient: Thread/unthread right sleeve, Put head through opening, Thread/unthread left sleeve, Pull shirt over trunk Assist Level: Touching or steadying assistance(Pt > 75%) Function - Lower Body Dressing/Undressing What is the patient wearing?: Underwear, Pants Position: Wheelchair/chair at sink Underwear - Performed by patient: Thread/unthread right underwear leg, Thread/unthread left underwear leg, Pull underwear up/down Pants- Performed by patient: Thread/unthread right pants leg, Thread/unthread left pants leg, Pull pants up/down Pants- Performed by helper: Thread/unthread right pants leg, Thread/unthread left pants leg (pt with severe headache and requested A) Non-skid slipper socks- Performed by patient: Don/doff right sock, Don/doff left sock Socks - Performed by patient: Don/doff right sock, Don/doff left sock Socks - Performed by helper: Don/doff right sock Shoes - Performed by patient: Don/doff right shoe, Don/doff left shoe Shoes - Performed by helper: Don/doff right shoe Assist for footwear: Partial/moderate assist Assist for  lower body dressing: Supervision or verbal cues  Function - Toileting Toileting steps completed by patient: Adjust clothing prior to toileting, Performs perineal hygiene, Adjust clothing after toileting Toileting steps completed  by helper: Performs perineal hygiene (per Doylene CanardSarah Younts, NT report) Toileting Assistive Devices: Grab bar or rail Assist level: Supervision or verbal cues  Function - ArchivistToilet Transfers Toilet transfer assistive device: Grab bar Assist level to toilet: Supervision or verbal cues Assist level from toilet: Supervision or verbal cues Assist level to bedside commode (at bedside): Moderate assist (Pt 50 - 74%/lift or lower) Assist level from bedside commode (at bedside): Moderate assist (Pt 50 - 74%/lift or lower)  Function - Chair/bed transfer Chair/bed transfer method: Ambulatory Chair/bed transfer assist level: Supervision or verbal cues Chair/bed transfer assistive device: Armrests Chair/bed transfer details: Verbal cues for precautions/safety  Function - Locomotion: Wheelchair Will patient use wheelchair at discharge?: No Function - Locomotion: Ambulation Assistive device: No device Max distance: 200 ft Assist level: Supervision or verbal cues Assist level: Supervision or verbal cues Assist level: Supervision or verbal cues Assist level: Supervision or verbal cues Assist level: Touching or steadying assistance (Pt > 75%)  Function - Comprehension Comprehension: Auditory Comprehension assist level: Understands basic 50 - 74% of the time/ requires cueing 25 - 49% of the time  Function - Expression Expression: Verbal Expression assist level: Expresses basic 50 - 74% of the time/requires cueing 25 - 49% of the time. Needs to repeat parts of sentences.  Function - Social Interaction Social Interaction assist level: Interacts appropriately 25 - 49% of time - Needs frequent redirection.  Function - Problem Solving Problem solving assist level: Solves basic  25 - 49% of the time - needs direction more than half the time to initiate, plan or complete simple activities  Function - Memory Memory assist level: Recognizes or recalls less than 25% of the time/requires cueing greater than 75% of the time Patient normally able to recall (first 3 days only): That he or she is in a hospital   Medical Problem List and Plan: 1. 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 Family involved   --still requires extensive cueing and redirection for impulsivity/concentration 2. DVT Prophylaxis/Anticoagulation: Mechanical: Sequential compression devices, below knee Bilateral lower extremities 3. Pain Management: oxycodone 5mg  Q4H prn 4. Mood: emotional lability--with improvement  -continue psychosocial support, family ed  -scheduled hs seroquel to assist with sleep-wake--effective  -added vpa to assist with emotional lability  -any suicidal ideations are more a product of impulsivity and fatigue 5. Neuropsych: This patient is not capable of making decisions on her own behalf.  - attention deficits---initiate ritalin trial 6. Skin/Wound Care: monitor PICC site, no decubiti 7. Fluids/Electrolytes/Nutrition: encourage po 8.  ?Seizure taken off Keppra by NS, will monitor 9.  Pulmonary contusions as well as aspiration pneumonia. 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 Tachycardia with episode of A flutter was on beta blocker as recommended by cardiology Dr Delton SeeNelson   12. Insomnia. seroquel as above  -improved sleep "hygiene" 13. Right-sided hearing deficit---improved but patient still reports impairment  -ear canal 90% clear  -will request audiology eval given temporal bone fracture and persistent deficits  ELOS (Days) 6 A FACE TO FACE EVALUATION WAS PERFORMED  Merrin Mcvicker T 03/14/2016, 9:00 AM

## 2016-03-15 ENCOUNTER — Inpatient Hospital Stay (HOSPITAL_COMMUNITY): Payer: Medicaid Other | Admitting: Speech Pathology

## 2016-03-15 ENCOUNTER — Inpatient Hospital Stay (HOSPITAL_COMMUNITY): Payer: Medicaid Other | Admitting: Occupational Therapy

## 2016-03-15 ENCOUNTER — Inpatient Hospital Stay (HOSPITAL_COMMUNITY): Payer: Medicaid Other | Admitting: Physical Therapy

## 2016-03-15 DIAGNOSIS — S06894D Other specified intracranial injury with loss of consciousness of 6 hours to 24 hours, subsequent encounter: Secondary | ICD-10-CM

## 2016-03-15 DIAGNOSIS — R42 Dizziness and giddiness: Secondary | ICD-10-CM

## 2016-03-15 DIAGNOSIS — F418 Other specified anxiety disorders: Secondary | ICD-10-CM

## 2016-03-15 MED ORDER — HALOPERIDOL LACTATE 5 MG/ML IJ SOLN
2.0000 mg | Freq: Four times a day (QID) | INTRAMUSCULAR | Status: DC | PRN
  Administered 2016-03-15: 2 mg via INTRAMUSCULAR
  Filled 2016-03-15: qty 1

## 2016-03-15 MED ORDER — METHYLPHENIDATE HCL 5 MG PO TABS
10.0000 mg | ORAL_TABLET | Freq: Two times a day (BID) | ORAL | Status: DC
  Administered 2016-03-15: 10 mg via ORAL
  Filled 2016-03-15: qty 2

## 2016-03-15 MED ORDER — DIVALPROEX SODIUM 500 MG PO DR TAB
500.0000 mg | DELAYED_RELEASE_TABLET | Freq: Two times a day (BID) | ORAL | Status: DC
  Administered 2016-03-16 – 2016-03-23 (×15): 500 mg via ORAL
  Filled 2016-03-15 (×15): qty 1

## 2016-03-15 MED ORDER — LORAZEPAM 2 MG/ML IJ SOLN: 1.0000 mg | Freq: Once | INTRAMUSCULAR | Status: DC

## 2016-03-15 MED ORDER — LORAZEPAM 2 MG/ML IJ SOLN
INTRAMUSCULAR | Status: AC
  Administered 2016-03-15: 1 mg
  Filled 2016-03-15: qty 1

## 2016-03-15 MED ORDER — HALOPERIDOL 2 MG PO TABS
2.0000 mg | ORAL_TABLET | Freq: Four times a day (QID) | ORAL | Status: DC | PRN
  Filled 2016-03-15: qty 1

## 2016-03-15 NOTE — Progress Notes (Signed)
Social Work Patient ID: Teodoro SprayHeather Kelliher, female   DOB: Sep 08, 1990, 25 y.o.   MRN: [id number]   Mother reports that pt is now verbalizing that she wants to harm herself and notes she has known h/o cutting herself (per mother, she has not done this x 3 yrs).  Mothers states, "I'm really scared" and concerned she may make attempts here on unit.  Alerted Marissa NestlePam Love, PA and plan that pt will be placed on suicide precautions.    Aaryn Parrilla, LCSW

## 2016-03-15 NOTE — Progress Notes (Signed)
03/15/16 1522 nursing Patient claimed that she has thoughts of hurting herself when she was arguing  to her mother further claims" that was when I was talking to my mother". Pam PA called RN that she is placing patient  on suicide precaution. RN notified charge RN; Room inspected and all contraband materials removed from the room; Initiated suicide precaution. Security came to check on patient. Sitter came for the patient. Kept patient safe.

## 2016-03-15 NOTE — Progress Notes (Signed)
Physical Therapy Weekly Progress Note  Patient Details  Name: Theresa Henderson MRN: [id number] Date of Birth: 05/23/1991  Beginning of progress report period: March 09, 2016 End of progress report period: March 16, 2016     Today's Date: 03/16/2016 PT Individual Time: 0915-1025 PT Individual Time Calculation (min): 70 min  Patient has met 2 of 3 short term goals. Patient continues to demonstrate behaviors consistent with [place] Level VI and requires max to total assist to complete familiar and functional tasks due to impaired attention, problem solving, awareness, and recall. Patient demonstrates verbally perseverative behaviors and remains verbose. Patient currently requires supervision without device for community distances but continues to intermittently complain of dizziness with functional mobility. Vestibular assessment ongoing. Patient and family education initiated but family will benefit from extensive continued education.   Patient continues to demonstrate the following deficits: muscle weakness, decreased endurance, decreased coordination, decreased attention, decreased awareness, decreased problem solving, decreased safety awareness and decreased memory, decreased standing balance and decreased balance strategies and therefore will continue to benefit from skilled PT intervention to enhance overall performance with activity tolerance, balance, postural control, ability to compensate for deficits, attention, awareness and coordination.  Patient not progressing toward long term goals.  See goal revision..  Plan of care revisions: recall goal downgraded to max A, emergent awareness downgraded to intellectual awareness with mod A, and alternating attention goal downgraded to sustained attention with min A.  PT Short Term Goals Week 1:  PT Short Term Goal 1 (Week 1): Pt will be able to gait with min assist in distracted environment PT Short Term Goal 1 - Progress (Week 1): Met PT Short Term  Goal 2 (Week 1): Pt will be able to demonstrate dynamic standing balance during functional tasks with steadying assist PT Short Term Goal 2 - Progress (Week 1): Met PT Short Term Goal 3 (Week 1): Pt will be able to demonstrate sustained attention with mod assist during sessions PT Short Term Goal 3 - Progress (Week 1): Not met Week 2:  PT Short Term Goal 1 (Week 2): = LTGs due to anticipated LOS   Skilled Therapeutic Interventions/Progress Updates:    Treatment 1: Upon arrival, patient upset about not having clothes in her room and flowers from friend being removed. Per MD and RN, ok to have flowers in room during day. Patient selected clothing from suitcase but when suitcase removed due to suicide precautions, patient became upset and perseverated on needing clothes in room closet and required more than reasonable time for redirection to requested task of showering. Patient eventually stood from bed, doffed clothing, and ambulated to shower. Patient showered sitting on shower chair with cues to decrease water temperature for safety. Patient performed UB/LB dressing seated EOB with supervision. Patient followed 2-step command for returning wheelchair to used equipment location with total A for recall. Gait training on treadmill at speed of 0.9-1.1 with BUE support with patient requiring min cues to recall to stop at 5 min. Patient c/o dizziness, provided supine rest break before returning to room with supervision cues for path finding. Patient left sitting on bed with sitter present.   Treatment 2: Patient in bed with mother and uncle present initially. Patient agreeable to eat lunch meal with therapist. Family departed and patient required encouragement to transfer out of bed to recliner with supervision. Patient consumed half a sandwich, cup of soup, and thin liquids at safe pace with mod-max verbal cues for attention to task. Patient perseverating on relationship with little sister   and gifts but easily  redirected with decreased lability this session. Patient reported not wanting hot chocolate from uncle anymore but required max-total A for functional recall task to locate uncle in family room and deliver message. Patient left sitting EOB with sitter present.    Therapy Documentation Precautions:  Precautions Precautions: Fall Precaution Comments: impulsive Restrictions Weight Bearing Restrictions: No Pain: Pain Assessment Pain Assessment: 0-10 Pain Score: 9  Pain Type: Acute pain Pain Location: Head Pain Descriptors / Indicators: Headache Pain Onset: On-going Pain Intervention(s): Rest  See Function Navigator for Current Functional Status.  Therapy/Group: Individual Therapy  Laretta Alstrom 03/16/2016, 10:57 AM

## 2016-03-15 NOTE — Progress Notes (Signed)
Speech Language Pathology Weekly Progress Note  Patient Details  Name: Theresa Henderson MRN: [id number] Date of Birth: 01-Aug-1990  Beginning of progress report period: March 08, 2016 End of progress report period: March 15, 2016   Short Term Goals: Week 1: SLP Short Term Goal 1 (Week 1): Pt to answer simple y/n questions with min A with 100% acc. SLP Short Term Goal 1 - Progress (Week 1): Met SLP Short Term Goal 2 (Week 1): Pt to demonstrate sustained attention to functional task with mod A. SLP Short Term Goal 2 - Progress (Week 1): Not met SLP Short Term Goal 3 (Week 1): Pt to demonstrate reading comprehension at the complex sentence- short paragraph level with min A.  SLP Short Term Goal 3 - Progress (Week 1): Met SLP Short Term Goal 4 (Week 1): Pt to tolerate trials of thin liquids with min A for use of compensatory strategies without overt s/s aspiration.  SLP Short Term Goal 4 - Progress (Week 1): Met SLP Short Term Goal 5 (Week 1): Pt demonstrate O x4 with min A.  SLP Short Term Goal 5 - Progress (Week 1): Met    New Short Term Goals: Week 2: SLP Short Term Goal 1 (Week 2): Patient will consume current diet without overt s/s of aspiration with supervision verbal cues for use of small bites/sips and a slow rate of self-feeding.  SLP Short Term Goal 2 (Week 2): Patient will demonstrate efficient mastication and oral clearance of regular textures without overt s/s of aspiration with supervision verbal cues over 2 consecutive sessions prior to upgrade.  SLP Short Term Goal 3 (Week 2): Patient will decrease perseveration and will switch topic of conversation with no more than 3 attempts for redirection from clincian in 50% of opportunities.  SLP Short Term Goal 4 (Week 2): Patient will demonstrate sustained attention to a functional task for 2 minutes with Mod A verbal cues for redirection.  SLP Short Term Goal 5 (Week 2): Patient will demonstrate functional problem solving for basic and  familair tasks with Mod A verbal cues for redirection.  SLP Short Term Goal 6 (Week 2): Patient will recall 1 event from a previous therapy session with Mod A question and verbal cues.   Weekly Progress Updates: Patient has made functional gains and has met 4 of 5 STG's this reporting period due to improved swallowing and cognitive function. Currently, patient continues to demonstrate behaviors consistent with a Rancho Level VI and requires overall Max-Total A to complete functional and familiar tasks safely in regards to attention, problem solving, awareness and recall. Patient also continues to demonstrate perseveration, impulsivity and verbosity. Patient is consuming Dys. 3 textures with thin liquids with minimal overt s/s of aspiration and requires Min A verbal cues for use of swallowing compensatory strategies. Patient and family education is ongoing. Patient would benefit from continued skilled SLP intervention to maximize her cognitive and swallowing function as well as her overall functional independence prior to discharge.     Intensity: Minumum of 1-2 x/day, 30 to 90 minutes Frequency: 3 to 5 out of 7 days Duration/Length of Stay: 03/23/16 Treatment/Interventions: Cognitive remediation/compensation;Cueing hierarchy;Environmental controls;Therapeutic Activities;Patient/family education;Functional tasks;Internal/external aids;Dysphagia/aspiration precaution training   Charish Schroepfer 03/15/2016, 4:08 PM

## 2016-03-15 NOTE — Progress Notes (Signed)
Contacted by SW regarding mother's concerns about patient's comments regarding cutting.  Team feels that patient at risk for self harm. Will place patient on suicide precautions. Psychiatry consulted for input.

## 2016-03-15 NOTE — Progress Notes (Signed)
Subjective/Complaints: Asking about her hearing loss "why". Wants to know if she can have her phone. Slept ok. Became emotional when talking about her sister and their relationship---very perseverative on their relationship   Review systems: Denies shortness of breath, nausea, vomiting, diarrhea or constipation. Limited by cognition, poor attention and perseveration  Objective: Vital Signs: Blood pressure 95/71, pulse 86, temperature 98.5 F (36.9 C), temperature source Oral, resp. rate 18, height 6\' 2"  (1.88 m), weight 95.3 kg (210 lb), last menstrual period 02/29/2016, SpO2 100 %. No results found. No results found for this or any previous visit (from the past 72 hour(s)).    General: No acute distress Mood and affect are appropriate Heart: Regular rate and rhythm no rubs murmurs or extra sounds Lungs: Clear to auscultation, breathing unlabored, no rales or wheezes Abdomen: Positive bowel sounds, soft nontender to palpation, nondistended Extremities: No clubbing, cyanosis, or edema Skin: No evidence of breakdown, no evidence of rash, ecchymosis behind the right ear Neurologic: Cranial nerves II through XII intact except for hearing right ear (dried blood however in ext canal).  motor strength is 5/5 in bilateral deltoid, bicep, tricep, grip, hip flexor, knee extensors, ankle dorsiflexor and plantar flexor Sensory exam normal sensation to light touch and proprioception in bilateral upper and lower extremities Cerebellar exam normal finger to nose to finger as well as heel to shin in bilateral upper and lower extremities Pt impulsive, easily distracted/attention waxes and wanes. Non-irritable, cooperative. Needs frequent redirection. Can perseverate at times Musculoskeletal: Full range of motion in all 4 extremities. No joint swelling   Assessment/Plan: 1. 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 Physiatrist is providing close team supervision and 24 hour management of active medical problems listed below. Physiatrist and rehab team continue to assess barriers to discharge/monitor patient progress toward functional and medical goals. FIM: Function - Bathing Position: Shower Body parts bathed by patient: Right arm, Left arm, Chest, Abdomen, Front perineal area, Buttocks, Right upper leg, Right lower leg, Left upper leg, Left lower leg Body parts bathed by helper: Back Bathing not applicable: Back Assist Level: Supervision or verbal cues  Function- Upper Body Dressing/Undressing What is the patient wearing?: Bra, Pull over shirt/dress Bra - Perfomed by patient: Thread/unthread right bra strap, Thread/unthread left bra strap Bra - Perfomed by helper: Hook/unhook bra (pull down sports bra) Pull over shirt/dress - Perfomed by patient: Thread/unthread right sleeve, Put head through opening, Thread/unthread left sleeve, Pull shirt over trunk Assist Level: Touching or steadying assistance(Pt > 75%) Function - Lower Body Dressing/Undressing What is the patient wearing?: Underwear, Pants Position: Wheelchair/chair at sink Underwear - Performed by patient: Thread/unthread right underwear leg, Thread/unthread left underwear leg, Pull underwear up/down Pants- Performed by patient: Thread/unthread right pants leg, Thread/unthread left pants leg, Pull pants up/down Pants- Performed by helper: Thread/unthread right pants leg, Thread/unthread left pants leg (pt with severe headache and requested A) Non-skid slipper socks- Performed by patient: Don/doff right sock, Don/doff left sock Socks - Performed by patient: Don/doff right sock, Don/doff left sock Socks - Performed by helper: Don/doff right sock Shoes - Performed by patient: Don/doff right shoe, Don/doff left shoe Shoes - Performed by helper: Don/doff right  shoe Assist for footwear: Partial/moderate assist Assist for lower body dressing: Touching or steadying assistance (Pt > 75%)  Function - Toileting Toileting steps completed by patient: Adjust clothing prior to toileting,  Performs perineal hygiene, Adjust clothing after toileting Toileting steps completed by helper: Performs perineal hygiene (per Doylene CanardSarah Younts, NT report) Toileting Assistive Devices: Grab bar or rail Assist level: Supervision or verbal cues  Function - ArchivistToilet Transfers Toilet transfer assistive device: Grab bar Assist level to toilet: Supervision or verbal cues Assist level from toilet: Supervision or verbal cues Assist level to bedside commode (at bedside): Moderate assist (Pt 50 - 74%/lift or lower) Assist level from bedside commode (at bedside): Moderate assist (Pt 50 - 74%/lift or lower)  Function - Chair/bed transfer Chair/bed transfer method: Ambulatory Chair/bed transfer assist level: Supervision or verbal cues Chair/bed transfer assistive device: Armrests Chair/bed transfer details: Verbal cues for precautions/safety  Function - Locomotion: Wheelchair Will patient use wheelchair at discharge?: No Function - Locomotion: Ambulation Assistive device: No device Max distance: 200 ft Assist level: Supervision or verbal cues Assist level: Supervision or verbal cues Assist level: Supervision or verbal cues Assist level: Supervision or verbal cues Assist level: Touching or steadying assistance (Pt > 75%)  Function - Comprehension Comprehension: Auditory Comprehension assist level: Understands basic 50 - 74% of the time/ requires cueing 25 - 49% of the time  Function - Expression Expression: Verbal Expression assist level: Expresses basic 50 - 74% of the time/requires cueing 25 - 49% of the time. Needs to repeat parts of sentences.  Function - Social Interaction Social Interaction assist level: Interacts appropriately 25 - 49% of time - Needs frequent  redirection.  Function - Problem Solving Problem solving assist level: Solves basic 25 - 49% of the time - needs direction more than half the time to initiate, plan or complete simple activities  Function - Memory Memory assist level: Recognizes or recalls less than 25% of the time/requires cueing greater than 75% of the time Patient normally able to recall (first 3 days only): That he or she is in a hospital   Medical Problem List and Plan: 1. 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 Family involved   --still requires extensive cueing and redirection for impulsivity/concentration/emotional at times 2. DVT Prophylaxis/Anticoagulation: Mechanical: Sequential compression devices, below knee Bilateral lower extremities 3. Pain Management: oxycodone 5mg  Q4H prn 4. Mood: emotional lability--with improvement  -continue psychosocial support, family ed  -scheduled hs seroquel to assist with sleep-wake--effective  -titrate depakote to 500mg  bid    5. Neuropsych: This patient is not capable of making decisions on her own behalf.  - attention deficits---increase ritalin to 10mg  6. Skin/Wound Care: monitor PICC site, no decubiti 7. Fluids/Electrolytes/Nutrition: encourage po 8.  ?Seizure taken off Keppra by NS, will monitor 9.  Pulmonary contusions as well as aspiration pneumonia. 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 Tachycardia with episode of A flutter was on beta blocker as recommended by cardiology Dr Delton SeeNelson   12. Insomnia. seroquel as above  -improved sleep "hygiene" 13. 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

## 2016-03-15 NOTE — Progress Notes (Signed)
Occupational Therapy Note  Patient Details  Name: Theresa SprayHeather Henderson MRN: [id number] Date of Birth: July 13, 1991  Today's Date: 03/15/2016 OT Missed Time: 30 Minutes Missed Time Reason: Other (comment) (Pt just placed on suicide precautions. Pt is very tearful and states, "Just leave me alone." )   Pt very tearful and shakes head "no" while also asking therapist to leave room. Pt remained supine in bed with bed alarm activated and call bell within reach.    Lowella Gripittman, Alianys Chacko L 03/15/2016, 3:49 PM
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
## 2016-03-15 NOTE — Progress Notes (Signed)
Physical Therapy Session Note  Patient Details  Name: Theresa Henderson MRN: [id number] Date of Birth: 1991-02-22  Today's Date: 03/15/2016 PT Individual Time: 1100-1130 and 1415-1520 PT Individual Time Calculation (min): 30 min and 65 min   Short Term Goals: Week 1:  PT Short Term Goal 1 (Week 1): Pt will be able to gait with min assist in distracted environment PT Short Term Goal 2 (Week 1): Pt will be able to demonstrate dynamic standing balance during functional tasks with steadying assist PT Short Term Goal 3 (Week 1): Pt will be able to demonstrate sustained attention with mod assist during sessions  Skilled Therapeutic Interventions/Progress Updates:    Treatment 1: Patient standing at sink in regular socks with mom present. Reinforced education and need to wear gripper/non-slip socks or shoes when OOB at all times and patient required max A multimodal cues to don shoes. Patient appeared fine with no complaints initially but upon attempting to engage in therapeutic activities including reviewing previous therapies with use of schedule, patient c/o fever, dizziness, nausea, and upset stomach. Vitals assessed and WFL. Patient required total A to initiate path finding task due to above complaints but eventually was able to navigate to therapy gym from room with min A > supervision. Patient followed 1- and 2-step commands to complete basic office copying task with min multimodal cues for attention and problem solving and required cues to utilize external aid to complete simple math problem to successfully complete task. Performed floor transfer using therapy mat for UE support with supervision. Engaged in BLE/core strengthening and balance task with multidirectional ball toss in tall kneeling > half kneeling with decreased stability noted in half kneeling position and patient frequently using mat table for UE support to prevent LOB. Patient left sitting edge of bed, handoff to SLP.   Treatment 2:  Patient sitting EOB with RN present attempting to administer medication. Patient verbose and perseverating on previous MD from [place] and her "happy pills" from that doctor and initially refusing to take medication, reporting being upset by her mood. Patient educated on purpose of current medications and eventually agreeable and took medication. Patient was unable to be redirected to therapeutic or functional activities due to perseverative behavior, therefore therapist departed and provided patient with 5 min break. Upon returning to room, patient able to be engaged in therapy with min cues. Performed path finding to gym with supervision. Patient negotiated up/down 2 flights of stairs in stairwell using 1 rail with supervision and max multimodal cues for attention and recall. Patient required mod-max A multimodal cues for attention, problem solving, and recall with functional task of organizing wheelchair cushions and wheelchairs by size and placing in correct location and labeling sizes appropriately. Patient continued to perseverate on needing "happy pills," missing her former MD from [place], needing to call him, and needing to talk to "a female doctor" throughout session. Upon returning to room, patient's mother present. Patient upset by mother and demanding that she bring her "happy pills" or leave. Attempted to educate patient on adverse effects of taking unprescribed medication with currently prescribed medication as well as redirection with no success. Patient overheard telling mother that she wanted to "cut" herself and was "highly depressed." CSW made aware and patient's mother reporting feeling "frightened" that patient might harm herself, therefore suicide precautions initiated. Treatment team aware. Patient left sitting on bed in room.    Therapy Documentation Precautions:  Precautions Precautions: Fall Precaution Comments: impulsive Restrictions Weight Bearing Restrictions: No Pain: Pain  Assessment  Pain Assessment: No/denies pain   See Function Navigator for Current Functional Status.   Therapy/Group: Individual Therapy  Kerney ElbeVarner, Vitali Seibert A 03/15/2016, 11:26 AM

## 2016-03-15 NOTE — Progress Notes (Signed)
Speech Language Pathology Daily Session Notes  Patient Details  Name: Theresa Henderson MRN: [id number] Date of Birth: 09-05-1990  Today's Date: 03/15/2016  Session 1: SLP Individual Time: [id number] SLP Individual Time Calculation (min): 60 min    Session 2: SLP Individual Time: [id number] SLP Individual Time Calculation (min): 35 min  Short Term Goals: Week 1: SLP Short Term Goal 1 (Week 1): Pt to answer simple y/n questions with min A with 100% acc. SLP Short Term Goal 2 (Week 1): Pt to demonstrate sustained attention to functional task with mod A. SLP Short Term Goal 3 (Week 1): Pt to demonstrate reading comprehension at the complex sentence- short paragraph level with min A.  SLP Short Term Goal 4 (Week 1): Pt to tolerate trials of thin liquids with min A for use of compensatory strategies without overt s/s aspiration.  SLP Short Term Goal 5 (Week 1): Pt demonstrate O x4 with min A.   Skilled Therapeutic Interventions:  Session 1: Skilled treatment session focused on dysphagia and cognitive goals. SLP facilitated session by providing frequent Max A multimodal cues for patient to attend to self-feeding due to severe verbosity, perseveration and lability throughout the session. Patient consumed her breakfast meal of Dys. 3 textures with thin liquids with overt cough X 2 on solid textures due to talking with a full oral cavity despite Max verbal cues. However, patient utilized small bites/sips with Min A verbal cues. Recommend patient upgrade to thin liquids with continued full supervision. Patient left upright in bed with mother present and all needs within reach. Continue with current plan of care.    Session 2: Skilled treatment session focused on cognitive goals. Upon arrival, patient appeared lethargic while supine in bed and reported she did not feel well. RN made aware [facility] with perseveration of topics despite Max A multimodal cues. Patient declined her lunch  meal and functional tasks despite multiple attempts and max encouragement. Patient's mother asking clinician questions in regards to d/c planning and home set-up to maximize safety. All questions answered at this time. Patient left supine in bed with alarm on and all needs within reach. Continue with current plan of care.   Function:  Eating Eating   Modified Consistency Diet: Yes Eating Assist Level: Supervision or verbal cues           Cognition Comprehension Comprehension assist level: Understands basic 50 - 74% of the time/ requires cueing 25 - 49% of the time  Expression   Expression assist level: Expresses basic 50 - 74% of the time/requires cueing 25 - 49% of the time. Needs to repeat parts of sentences.  Social Interaction Social Interaction assist level: Interacts appropriately 25 - 49% of time - Needs frequent redirection.  Problem Solving Problem solving assist level: Solves basic 25 - 49% of the time - needs direction more than half the time to initiate, plan or complete simple activities  Memory Memory assist level: Recognizes or recalls less than 25% of the time/requires cueing greater than 75% of the time    Pain Pain Assessment Pain Assessment: No/denies pain  Therapy/Group: Individual Therapy  Davian Hanshaw 03/15/2016, 12:39 PM

## 2016-03-16 ENCOUNTER — Inpatient Hospital Stay (HOSPITAL_COMMUNITY): Payer: Medicaid Other | Admitting: Physical Therapy

## 2016-03-16 ENCOUNTER — Inpatient Hospital Stay (HOSPITAL_COMMUNITY): Payer: Self-pay | Admitting: Occupational Therapy

## 2016-03-16 ENCOUNTER — Inpatient Hospital Stay (HOSPITAL_COMMUNITY): Payer: Medicaid Other | Admitting: Speech Pathology

## 2016-03-16 DIAGNOSIS — F02B18 Dementia in other diseases classified elsewhere, moderate, with other behavioral disturbance: Secondary | ICD-10-CM

## 2016-03-16 DIAGNOSIS — F0281 Dementia in other diseases classified elsewhere with behavioral disturbance: Secondary | ICD-10-CM

## 2016-03-16 DIAGNOSIS — R51 Headache: Secondary | ICD-10-CM

## 2016-03-16 DIAGNOSIS — S069X9S Unspecified intracranial injury with loss of consciousness of unspecified duration, sequela: Secondary | ICD-10-CM

## 2016-03-16 MED ORDER — FLUOXETINE HCL 20 MG PO CAPS
40.0000 mg | ORAL_CAPSULE | Freq: Every day | ORAL | Status: DC
  Administered 2016-03-17 – 2016-03-23 (×7): 40 mg via ORAL
  Filled 2016-03-16 (×7): qty 2

## 2016-03-16 MED ORDER — FLUOXETINE HCL 20 MG PO CAPS
20.0000 mg | ORAL_CAPSULE | Freq: Once | ORAL | Status: AC
  Administered 2016-03-16: 20 mg via ORAL
  Filled 2016-03-16: qty 1

## 2016-03-16 MED ORDER — QUETIAPINE FUMARATE 25 MG PO TABS
25.0000 mg | ORAL_TABLET | Freq: Two times a day (BID) | ORAL | Status: DC
  Administered 2016-03-16 – 2016-03-23 (×14): 25 mg via ORAL
  Filled 2016-03-16 (×15): qty 1

## 2016-03-16 MED ORDER — QUETIAPINE FUMARATE 50 MG PO TABS
50.0000 mg | ORAL_TABLET | Freq: Every day | ORAL | Status: DC
  Administered 2016-03-16 – 2016-03-22 (×7): 50 mg via ORAL
  Filled 2016-03-16 (×7): qty 1

## 2016-03-16 MED ORDER — ENSURE ENLIVE PO LIQD
237.0000 mL | Freq: Three times a day (TID) | ORAL | Status: DC
  Administered 2016-03-17 – 2016-03-22 (×10): 237 mL via ORAL
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
## 2016-03-16 NOTE — Plan of Care (Signed)
Problem: RH SAFETY Goal: RH STG ADHERE TO SAFETY PRECAUTIONS W/ASSISTANCE/DEVICE STG Adhere to Safety Precautions With min Assistance/Device.  Outcome: Not Progressing Total assist for safety.

## 2016-03-16 NOTE — Progress Notes (Signed)
Occupational Therapy Session Note  Patient Details  Name: Theresa Henderson MRN: [id number] Date of Birth: Jul 20, 1991  Today's Date: 03/16/2016 OT Individual Time: 1115-1200 OT Individual Time Calculation (min): 45 min     Short Term Goals: Week 1:  OT Short Term Goal 1 (Week 1): Pt will complete grooming standing at sink level with min assistance for dynamic balance. OT Short Term Goal 2 (Week 1): Pt will tolerate 30 minutes of therapeutic intervention with no emotional outbursts, as appropriate. OT Short Term Goal 3 (Week 1): Pt will don socks and shoes with supervision for increase in LB dressing independence. OT Short Term Goal 4 (Week 1): Pt will attend to 3 minutes of cognitive task with zero need for redirection.  Skilled Therapeutic Interventions/Progress Updates:   1:1 focus on participation in cognitive remediation task of meal planning with weekly grocery store adds.  Pt required assistance to initiating looking ads to find items under topics listed out on paper. Pt able to preform task with mod questioning cues for functional problem solving and to fully complete task. Pt declined out of bed activity but made the choice to participate in activity in bed with HOB elevated. Pt then able to assist with adding up prices for groceries choosen with max A for simple math with A to write out problems to help maintain engagement in task. Able to complete with extra time. Pt's sitter remained present. Pt missed 15 due to HA- RN made aware  Therapy Documentation Precautions:  Precautions Precautions: Fall Precaution Comments: impulsive Restrictions Weight Bearing Restrictions: No General: General OT Amount of Missed Time: 14 Minutes Vital Signs: Therapy Vitals Temp: 98.4 F (36.9 C) Temp Source: Oral Pulse Rate: 78 Resp: 20 BP: (!) 97/43 Patient Position (if appropriate): Lying Oxygen Therapy SpO2: 98 % O2 Device: Not Delivered Pain: Pain Assessment Faces Pain Scale: Hurts  whole lot Pain Type: Acute pain Pain Location: Head Pain Descriptors / Indicators: Headache Pain Intervention(s): Medication (See eMAR) ADL: ADL Where Assessed-Upper Body Bathing:  (sitting on tub bench) ADL Comments: see functional navigator  See Function Navigator for Current Functional Status.   Therapy/Group: Individual Therapy  Roney MansSmith, Cloris F[facility] 03/16/2016, 3:12 PM

## 2016-03-16 NOTE — Plan of Care (Signed)
Problem: RH SAFETY Goal: RH STG DECREASED RISK OF FALL WITH ASSISTANCE STG Decreased Risk of Fall With min Assistance.  Outcome: Not Progressing Sitting on floor. Doesn't always follow commands.

## 2016-03-16 NOTE — Significant Event (Signed)
At 1945, sitter reports patient on floor. Patient agitated and combative, pushing at staff. Sitting on floor looking in bags. Requesting her phone, which Mom has. Per previous report, patient slept from 1700-1900, upon awakening, not agitated, requesting to call Mom, RN allowed patient to call Mom. Repeating to Mom, "ya'll are done with me, come get your shit!" Wanting to go home. Luggage in room removed, which agitated patient more, threatening to hit staff. Patient agreeable to sit on bed, if she could look in luggage. Allowed patient to look in luggage with supervision.  Continues to perseverate on going home. Security paged to assist, patient attempting to leave unit and threatening staff. Randomly sitting in floor. Wanting to lay on floor in bathroom.  Hospital supervisor, Crystal and Nursing Director, [name] made aware of situation. Patient is not competent to make decisions for herself. Paged Dr. [name] at 2025, Ativan 1mg  given per orders. Continued to threaten staff, combative and attempting to leave. Paged Elliott[place]unice, [place] at 2115 Haldol 2mg  given IM per orders. At 2230 agreable to go to bed, if she can talk with Mom at 0800. In bed calm/asleep since 2230. Sitter at bedside. Spoke with patient's Mom numerous times during course of  events. She was concerned, appreciative and apologetic. She will visit in AM. "I will bring her teddy bear."  Reports reluctance to take patient home if behavior continues. Emotional support provided to Mom.

## 2016-03-16 NOTE — Progress Notes (Signed)
Subjective/Complaints: Had a bad afternoon and evening. Became very upset and agitated. Threatened to cut herself again. Pt calmer this morning but still quite tearful, peseverative. Remembers generally what happened last night and is upset that she angered family and staff. Concerned that she's not on her "happy pill" that she's been taking for depression PTA.   Review systems: Denies shortness of breath, nausea, vomiting, diarrhea or constipation. Limited by cognition, poor attention and perseveration  Objective: Vital Signs: Blood pressure 101/69, pulse 92, temperature 98.6 F (37 C), temperature source Oral, resp. rate 17, height 6\' 2"  (1.88 m), weight 89.4 kg (197 lb 1.6 oz), last menstrual period 02/29/2016, SpO2 98 %. No results found. No results found for this or any previous visit (from the past 72 hour(s)).    General: No acute distress Mood and affect are appropriate Heart: Regular rate and rhythm no rubs murmurs or extra sounds Lungs: Clear to auscultation, breathing unlabored, no rales or wheezes Abdomen: Positive bowel sounds, soft nontender to palpation, nondistended Extremities: No clubbing, cyanosis, or edema Skin: No evidence of breakdown, no evidence of rash, ecchymosis behind the right ear Neurologic: Cranial nerves II through XII intact except for hearing right ear (dried blood however in ext canal).  motor strength is 5/5 in bilateral deltoid, bicep, tricep, grip, hip flexor, knee extensors, ankle dorsiflexor and plantar flexor Sensory exam normal sensation to light touch and proprioception in bilateral upper and lower extremities Cerebellar exam normal finger to nose to finger as well as heel to shin in bilateral upper and lower extremities Pt impulsive, easily distracted/attention waxes and wanes. Tearful/emotional. Not agitated. Needs frequent redirection. perseverative Musculoskeletal: Full range of motion in all 4 extremities. No joint  swelling   Assessment/Plan: 1. 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 Physiatrist is providing close team supervision and 24 hour management of active medical problems listed below. Physiatrist and rehab team continue to assess barriers to discharge/monitor patient progress toward functional and medical goals. FIM: Function - Bathing Position: Shower Body parts bathed by patient: Right arm, Left arm, Chest, Abdomen, Front perineal area, Buttocks, Right upper leg, Right lower leg, Left upper leg, Left lower leg Body parts bathed by helper: Back Bathing not applicable: Back Assist Level: Supervision or verbal cues  Function- Upper Body Dressing/Undressing What is the patient wearing?: Bra, Pull over shirt/dress Bra - Perfomed by patient: Thread/unthread right bra strap, Thread/unthread left bra strap Bra - Perfomed by helper: Hook/unhook bra (pull down sports bra) Pull over shirt/dress - Perfomed by patient: Thread/unthread right sleeve, Put head through opening, Thread/unthread left sleeve, Pull shirt over trunk Assist Level: Supervision or verbal cues Function - Lower Body Dressing/Undressing What is the patient wearing?: Underwear, Pants, Socks, Shoes Position: Sitting EOB Underwear - Performed by patient: Thread/unthread right underwear leg, Thread/unthread left underwear leg, Pull underwear up/down Pants- Performed by patient: Thread/unthread right pants leg, Thread/unthread left pants leg, Pull pants up/down Pants- Performed by helper: Thread/unthread right pants leg, Thread/unthread left pants leg (pt with severe headache and requested A) Non-skid slipper socks- Performed by patient: Don/doff right sock, Don/doff left sock Socks - Performed by patient: Don/doff right sock, Don/doff left sock Socks - Performed by helper:  Don/doff right sock Shoes - Performed by patient: Don/doff right shoe, Don/doff left shoe Shoes - Performed by helper: Don/doff right shoe Assist for footwear: Setup Assist for lower body  dressing: Set up, Supervision or verbal cues Set up : To obtain clothing/put away  Function - Toileting Toileting steps completed by patient: Adjust clothing prior to toileting, Performs perineal hygiene, Adjust clothing after toileting Toileting steps completed by helper: Performs perineal hygiene (per Doylene CanardSarah Younts, NT report) Toileting Assistive Devices: Grab bar or rail Assist level: Supervision or verbal cues  Function - ArchivistToilet Transfers Toilet transfer assistive device: Grab bar Assist level to toilet: Supervision or verbal cues Assist level from toilet: Supervision or verbal cues Assist level to bedside commode (at bedside): Moderate assist (Pt 50 - 74%/lift or lower) Assist level from bedside commode (at bedside): Moderate assist (Pt 50 - 74%/lift or lower)  Function - Chair/bed transfer Chair/bed transfer method: Ambulatory Chair/bed transfer assist level: Supervision or verbal cues Chair/bed transfer assistive device: Armrests Chair/bed transfer details: Verbal cues for precautions/safety  Function - Locomotion: Wheelchair Will patient use wheelchair at discharge?: No Function - Locomotion: Ambulation Assistive device: No device Max distance: 300 ft Assist level: Supervision or verbal cues Assist level: Supervision or verbal cues Assist level: Supervision or verbal cues Assist level: Supervision or verbal cues Assist level: Touching or steadying assistance (Pt > 75%)  Function - Comprehension Comprehension: Auditory Comprehension assist level: Understands basic 50 - 74% of the time/ requires cueing 25 - 49% of the time  Function - Expression Expression: Verbal Expression assist level: Expresses basic 50 - 74% of the time/requires cueing 25 - 49% of the time. Needs to repeat parts  of sentences.  Function - Social Interaction Social Interaction assist level: Interacts appropriately 25 - 49% of time - Needs frequent redirection.  Function - Problem Solving Problem solving assist level: Solves basic 25 - 49% of the time - needs direction more than half the time to initiate, plan or complete simple activities  Function - Memory Memory assist level: Recognizes or recalls less than 25% of the time/requires cueing greater than 75% of the time Patient normally able to recall (first 3 days only): That he or she is in a hospital   Medical Problem List and Plan: 1. 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 Family involved   -likely increased agitation and anxiety yesterday related to ritalin  -spoke at length today with patient and separately with mother.  -continue to work on schedule and calm environment/provide education as needed. 2. DVT Prophylaxis/Anticoagulation: Mechanical: Sequential compression devices, below knee Bilateral lower extremities 3. Pain Management: oxycodone 5mg  Q4H prn 4. Mood: emotional lability--with improvement until yesterday  -continue psychosocial support, family ed  -added day time seroquel 25mg  bid in addition to hs seroquel which I increased to 50mg   -titrated depakote to 500mg  bid---check level monday  -ritalin stopped  -increased prozac to 40mg  daily  -suicide precautions for now given impulsive behavior  -neuropsych follow up    5. Neuropsych: This patient is not capable of making decisions on her own behalf.  - attention deficits--although her attention is a major factor, cannot risk exacerbating her emotional state---ritalin stopped 6. Skin/Wound Care: monitor PICC site, no decubiti 7. Fluids/Electrolytes/Nutrition: encourage po 8.  ?Seizure taken off Keppra by NS, will monitor 9.  Pulmonary contusions as well as aspiration pneumonia. abx complete 10.  Post traumatic  headache   topamax qhs   11. Tachycardia with episode of A flutter was on beta blocker as recommended by cardiology Dr Delton SeeNelson   12. Insomnia. seroquel as above  -improved sleep "hygiene" in general 13. 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

## 2016-03-16 NOTE — Progress Notes (Signed)
Speech Language Pathology Daily Session Note  Patient Details  Name: Teodoro SprayHeather Giovanelli MRN: [id number] Date of Birth: 1990-09-15  Today's Date: 03/16/2016  Session #1 SLP Individual Time: 08:00- 09:00  SLP Individual Time Calculation (min): 60 min  Session #2 SLP Individual Time: 1400-1445 SLP Individual Time Calculation (min): 45 min  Short Term Goals: Week 2: SLP Short Term Goal 1 (Week 2): Patient will consume current diet without overt s/s of aspiration with supervision verbal cues for use of small bites/sips and a slow rate of self-feeding.  SLP Short Term Goal 2 (Week 2): Patient will demonstrate efficient mastication and oral clearance of regular textures without overt s/s of aspiration with supervision verbal cues over 2 consecutive sessions prior to upgrade.  SLP Short Term Goal 3 (Week 2): Patient will decrease perseveration and will switch topic of conversation with no more than 3 attempts for redirection from clincian in 50% of opportunities.  SLP Short Term Goal 4 (Week 2): Patient will demonstrate sustained attention to a functional task for 2 minutes with Mod A verbal cues for redirection.  SLP Short Term Goal 5 (Week 2): Patient will demonstrate functional problem solving for basic and familair tasks with Mod A verbal cues for redirection.  SLP Short Term Goal 6 (Week 2): Patient will recall 1 event from a previous therapy session with Mod A question and verbal cues.   Skilled Therapeutic Interventions:  Session #1:  Skilled treatment session focused on dysphagia and cognition goals. SLP facilitated session by providing Max A verbal cues for redirection to tasks and to decrease verbose perseverative comments previous interactions with family. Pt very upset with previous day's interactions with family and that nursing had removed her suitcase and clothing. Pt able to utilize call bell to express concerns to nursing who agreed to work on plan for pt to get dressed during OT/PT.   With Max A encouragement and redirection, pt willing to consume 5 bites of eggs. Pt tolerated without overt s/s of aspiration. Pt able to take small bites with supervision reminders. With max encouragement, pt willing to attempt problem solving tasks. Pt required Min A verbal cues when demonstrating functional problem solving for basic tasks.  Pt left in bed with all needs within reach and sitter present.   Session #2: Skilled treatment session focused cognition goals. Pt's mother and cousin were present during session and therapy activity included pt's cousin for increased participation and success. SLP facilitated session by guidelines for perseverative comments. Pt was perseverating on having her hair cut. SLP gave pt time frame (30 minutes) to refrain from speaking about her hair. Pt agreeable and didn't mention her hair for 45 minutes. Pt able to engage in functional game with cousin and demonstrated problem solving for basic to semi-complex tasks with no cues needed for redirection to task. Pt with good progress this session and appeared to enjoy task with her cousin. Pt was left in bed with all needs within reach and sitter present. After session, education provided to pt's mother about strategies to help pt with perseverative comments.  Continue current plan of care.    Function:  Eating Eating   Modified Consistency Diet: Yes Eating Assist Level: Supervision or verbal cues   Eating Set Up Assist For: Opening containers       Cognition Comprehension Comprehension assist level: Understands basic 50 - 74% of the time/ requires cueing 25 - 49% of the time  Expression   Expression assist level: Expresses basic 50 - 74% of  the time/requires cueing 25 - 49% of the time. Needs to repeat parts of sentences.  Social Interaction Social Interaction assist level: Interacts appropriately 25 - 49% of time - Needs frequent redirection.  Problem Solving Problem solving assist level: Solves basic 25 -  49% of the time - needs direction more than half the time to initiate, plan or complete simple activities  Memory Memory assist level: Recognizes or recalls less than 25% of the time/requires cueing greater than 75% of the time    Pain Pain Assessment Faces Pain Scale: Hurts whole lot Pain Type: Acute pain Pain Location: Head Pain Descriptors / Indicators: Headache Pain Intervention(s): Medication (See eMAR)  Therapy/Group: Individual Therapy  Lorelei Heikkila 03/16/2016, 3:58 PM

## 2016-03-16 NOTE — Plan of Care (Signed)
Problem: RH COGNITION-NURSING Goal: RH STG USES MEMORY AIDS/STRATEGIES W/ASSIST TO PROBLEM SOLVE STG Uses Memory Aids/Strategies With min Assistance to Problem Solve.  Poor problem solving.

## 2016-03-17 ENCOUNTER — Inpatient Hospital Stay (HOSPITAL_COMMUNITY): Payer: Medicaid Other | Admitting: Physical Therapy

## 2016-03-17 ENCOUNTER — Inpatient Hospital Stay (HOSPITAL_COMMUNITY): Payer: Medicaid Other | Admitting: Occupational Therapy

## 2016-03-17 DIAGNOSIS — S069X9S Unspecified intracranial injury with loss of consciousness of unspecified duration, sequela: Secondary | ICD-10-CM

## 2016-03-17 DIAGNOSIS — F0281 Dementia in other diseases classified elsewhere with behavioral disturbance: Secondary | ICD-10-CM

## 2016-03-17 NOTE — Plan of Care (Signed)
Problem: RH BOWEL ELIMINATION Goal: RH STG MANAGE BOWEL W/MEDICATION W/ASSISTANCE STG Manage Bowel with Medication with mod I Assistance.  Outcome: Not Progressing Refused senna

## 2016-03-17 NOTE — Progress Notes (Addendum)
Occupational Therapy Weekly Progress Note  Patient Details  Name: Theresa Henderson MRN: [id number] Date of Birth: March 01, 1991  Beginning of progress report period: March 09, 2016 End of progress report period: March 17, 2016  Today's Date: 03/17/2016 OT Individual Time: 1000-1100 OT Individual Time Calculation (min): 60 min     Patient has met 2 of 4 short term goals.  Pt has made a great deal of progress with her dynamic balance which is now at a close S level and basic ADL skills which are now at a Supervision level. On admission, she was needing steadying A to min A.  She continues to have severely impaired cognition skills of attention, reasoning, awareness, memory.  She is now fully oriented and is able to recall some day to day information, but has an attention span of less than a minute with most tasks.  She is tangential in her speech, with sporadic bouts of confabulation.  She is also demonstrating behavioral delays of lability, frustration, occasional inappropriate comments, and child like behavior. Pt is able to express her needs very clearly and although she often has a polite tone, she can be quite demanding on her caregivers (family, staff) and frequently asks for help with tasks that she is physically capable of doing (such as pulling down her shirt, rinsing the conditioner out of her hair).    Patient continues to demonstrate the following deficits: dynamic balance, cognition, behavior (see note above) and therefore will continue to benefit from skilled OT intervention to enhance overall performance with BADL. Pt demonstrates behavior/cognition deficits at a [place] level.   Patient progressing toward long term goals..  Continue plan of care.  OT Short Term Goals Week 1:  OT Short Term Goal 1 (Week 1): Pt will complete grooming standing at sink level with min assistance for dynamic balance. OT Short Term Goal 1 - Progress (Week 1): Met OT Short Term Goal 2 (Week 1): Pt will  tolerate 30 minutes of therapeutic intervention with no emotional outbursts, as appropriate. OT Short Term Goal 2 - Progress (Week 1): Progressing toward goal OT Short Term Goal 3 (Week 1): Pt will don socks and shoes with supervision for increase in LB dressing independence. OT Short Term Goal 3 - Progress (Week 1): Met OT Short Term Goal 4 (Week 1): Pt will attend to 3 minutes of cognitive task with zero need for redirection. OT Short Term Goal 4 - Progress (Week 1): Progressing toward goal Week 2:  OT Short Term Goal 1 (Week 2): STGs = LTGs   Skilled Therapeutic Interventions/Progress Updates:    Pt seen for skilled OT to facilitate cognitive skills, balance, behavior.  Pt received in bed with sitter in the room. Sat to EOB with no dizziness.  Pt expressed great frustration with not having access to her clothing.  Therapist brought in clean clothing to the room and pt perseverated on the idea that they were not clean and she needed access to her belongings.  Max cues needed to redirect pt to begin shower.  Pt continually went back to this topic, stating she is refusing to eat all meals until she can get her clothing. Pt finally agreed to shower. Close S with mobility and all self care tasks including shaving her legs. Pt would continually ask this therapist to do things for her, even though she is physically capable.  When it was suggested for pt to sit to dry feet, pt stated " no, I will stand!" Pt was able  to safely reach to feet in standing.  Pt dressed and then began crying about not having all clothing and how she will go back to bed until she receives her belongings. Max cues to redirect pt to complete grooming tasks. Physician arrived and discussed issue with pt. Pt repeating her concerns to him numerous times but expressing awareness, "I am trying to stay very calm". Pt completed a few standing balance exercises at sink and then needed to lay down. Pt in bed in room with sitter present.      Therapy Documentation Precautions:  Precautions Precautions: Fall Precaution Comments: impulsive Restrictions Weight Bearing Restrictions: No  Pain:  no c/o pain  ADL:  See Function Navigator for Current Functional Status.   Therapy/Group: Individual Therapy  [place] 03/17/2016, 3:52 PM

## 2016-03-17 NOTE — Progress Notes (Signed)
Physical Therapy Session Note  Patient Details  Name: Theresa SprayHeather Henderson MRN: [id number] Date of Birth: May 26, 1991  Today's Date: 03/17/2016 PT Individual Time: 1450-1545 PT Individual Time Calculation (min): 55 min   Skilled Therapeutic Interventions/Progress Updates:    Session focused on sustained attention, working memory, functional recall, increasing functional independence, standing balance, and activity tolerance. Patient asleep in bed with sitting present. Patient awakened to voice and inquiring about sweet tea ordered by RN. Patient required max-total verbal cues to initiate/attend to task due to perseverative and tangential conversation and more than reasonable time to sit EOB and don shoes. Patient continued to require total cues for topic maintenance to locate RN and request drink. At sink, patient requesting therapist complete grooming tasks that patient is physically capable of completing and required max encouragement with decreased frustration tolerance to task. Performed path finding from room to and from gym with supervision. Patient engaged in NuStep using BUE/BLE at level 6 x 15 min with focus on attention to task and activity tolerance with min cues. Standing on foam wedge to challenge balance reactions, patient engaged in simple sorting tasks to group/identify small animal figurines by different categories. Patient required total A to complete more abstract task (animals that live in [place], animals you would find on a farm, etc) and min cues for concrete sorting (animals that swim, animals with 2 legs, etc). Patient refused therapist's explanation of correct answers with poor frustration tolerance. To challenge working memory and attention, patient given instructions to return plastic water bottle to day room recycling container with total cues for immediate recall of task. Patient left in room with RN.    Therapy Documentation Precautions:  Precautions Precautions: Fall Precaution  Comments: impulsive Restrictions Weight Bearing Restrictions: No Pain: Pain Assessment Pain Assessment: No/denies pain   See Function Navigator for Current Functional Status.   Therapy/Group: Individual Therapy  Kerney ElbeVarner, Deja Kaigler A 03/17/2016, 4:42 PM

## 2016-03-17 NOTE — Progress Notes (Signed)
03/17/16 1630 nursing Dr. Wynn BankerKirsteins notified of psychiatrist orders re: d/c suicide precaution.

## 2016-03-17 NOTE — Plan of Care (Signed)
Problem: RH BOWEL ELIMINATION Goal: RH STG MANAGE BOWEL WITH ASSISTANCE STG Manage Bowel with mod I Assistance.  Outcome: Not Progressing Poor appetite; LBM 8/15

## 2016-03-17 NOTE — Progress Notes (Addendum)
Subjective/Complaints: Patient has been on suicide precautions, spoke to sitter, patient has been calm and cooperative. since her agitation yesterday. No issues overnight. She has been working with occupational therapy on her ADLs.   Review systems: Denies shortness of breath, nausea, vomiting, diarrhea or constipation. Limited by cognition, poor attention and perseveration, no suicidal plan  Objective: Vital Signs: Blood pressure (!) 99/56, pulse 82, temperature 98 F (36.7 C), temperature source Oral, resp. rate 16, height 6\' 2"  (1.88 m), weight 89.4 kg (197 lb 1.6 oz), last menstrual period 02/29/2016, SpO2 98 %. No results found. No results found for this or any previous visit (from the past 72 hour(s)).    General: No acute distress Mood and affect are appropriate Heart: Regular rate and rhythm no rubs murmurs or extra sounds Lungs: Clear to auscultation, breathing unlabored, no rales or wheezes Abdomen: Positive bowel sounds, soft nontender to palpation, nondistended Extremities: No clubbing, cyanosis, or edema Skin: No evidence of breakdown, no evidence of rash, ecchymosis behind the right ear Neurologic: Cranial nerves II through XII intact except for hearing right ear (dried blood however in ext canal).  motor strength is 5/5 in bilateral deltoid, bicep, tricep, grip, hip flexor, knee extensors, ankle dorsiflexor and plantar flexor Sensory exam normal sensation to light touch and proprioception in bilateral upper and lower extremities Cerebellar exam normal finger to nose to finger as well as heel to shin in bilateral upper and lower extremities Pt impulsive, easily distracted/attention waxes and wanes. Tearful/emotional. Not agitated. Needs frequent redirection. perseverative Musculoskeletal: Full range of motion in all 4 extremities. No joint swelling   Assessment/Plan: 1. 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 Physiatrist is providing close team supervision and 24 hour management of active medical problems listed below. Physiatrist and rehab team continue to assess barriers to discharge/monitor patient progress toward functional and medical goals. FIM: Function - Bathing Position: Shower Body parts bathed by patient: Right arm, Left arm, Chest, Abdomen, Front perineal area, Buttocks, Right upper leg, Right lower leg, Left upper leg, Left lower leg Body parts bathed by helper: Back Bathing not applicable: Back Assist Level: Supervision or verbal cues  Function- Upper Body Dressing/Undressing What is the patient wearing?: Bra, Pull over shirt/dress Bra - Perfomed by patient: Thread/unthread right bra strap, Thread/unthread left bra strap Bra - Perfomed by helper: Hook/unhook bra (pull down sports bra) Pull over shirt/dress - Perfomed by patient: Thread/unthread right sleeve, Put head through opening, Thread/unthread left sleeve, Pull shirt over trunk Assist Level: Supervision or verbal cues Function - Lower Body Dressing/Undressing What is the patient wearing?: Underwear, Pants, Socks, Shoes Position: Sitting EOB Underwear - Performed by patient: Thread/unthread right underwear leg, Thread/unthread left underwear leg, Pull underwear up/down Pants- Performed by patient: Thread/unthread right pants leg, Thread/unthread left pants leg, Pull pants up/down Pants- Performed by helper: Thread/unthread right pants leg, Thread/unthread left pants leg (pt with severe headache and requested A) Non-skid slipper socks- Performed by patient: Don/doff right sock, Don/doff left sock Socks - Performed by patient: Don/doff right sock, Don/doff left sock Socks - Performed by helper: Don/doff right sock Shoes - Performed by patient: Don/doff right shoe, Don/doff left shoe Shoes - Performed by helper: Don/doff right  shoe Assist for footwear: Setup Assist for lower body dressing: Set up, Supervision or verbal cues Set up : To obtain clothing/put away  Function - Toileting Toileting  steps completed by patient: Adjust clothing prior to toileting, Performs perineal hygiene, Adjust clothing after toileting Toileting steps completed by helper: Performs perineal hygiene (per Doylene CanardSarah Younts, NT report) Toileting Assistive Devices: Grab bar or rail Assist level: Supervision or verbal cues  Function - ArchivistToilet Transfers Toilet transfer assistive device: Grab bar Assist level to toilet: Supervision or verbal cues Assist level from toilet: Supervision or verbal cues Assist level to bedside commode (at bedside): Moderate assist (Pt 50 - 74%/lift or lower) Assist level from bedside commode (at bedside): Moderate assist (Pt 50 - 74%/lift or lower)  Function - Chair/bed transfer Chair/bed transfer method: Ambulatory Chair/bed transfer assist level: Supervision or verbal cues Chair/bed transfer assistive device: Armrests Chair/bed transfer details: Verbal cues for precautions/safety  Function - Locomotion: Wheelchair Will patient use wheelchair at discharge?: No Function - Locomotion: Ambulation Assistive device: No device Max distance: 300 ft Assist level: Supervision or verbal cues Assist level: Supervision or verbal cues Assist level: Supervision or verbal cues Assist level: Supervision or verbal cues Assist level: Touching or steadying assistance (Pt > 75%)  Function - Comprehension Comprehension: Auditory Comprehension assist level: Understands basic 50 - 74% of the time/ requires cueing 25 - 49% of the time  Function - Expression Expression: Verbal Expression assist level: Expresses basic 50 - 74% of the time/requires cueing 25 - 49% of the time. Needs to repeat parts of sentences.  Function - Social Interaction Social Interaction assist level: Interacts appropriately 25 - 49% of time - Needs  frequent redirection.  Function - Problem Solving Problem solving assist level: Solves basic 25 - 49% of the time - needs direction more than half the time to initiate, plan or complete simple activities  Function - Memory Memory assist level: Recognizes or recalls less than 25% of the time/requires cueing greater than 75% of the time Patient normally able to recall (first 3 days only): That he or she is in a hospital   Medical Problem List and Plan: 1. 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 Family involved, d/c planned for 8/25   -likely increased agitation and anxiety yesterday related to ritalin  -spoke at length today with patient and separately with mother.  -continue to work on schedule and calm environment/provide education as needed. 2. DVT Prophylaxis/Anticoagulation: Mechanical: Sequential compression devices, below knee Bilateral lower extremities 3. Pain Management: oxycodone 5mg  Q4H prn 4. Mood: emotional lability--premorbid depression and what sounds to be self mutilation cutting behavior , no clearcut suicidal ideation, did indicate to mother that she wanted to cut herself yesterday, Appreciate Psychiatry consult-D/C sitter  -continue psychosocial support, family ed  -TBI related agitation, added day time seroquel 25mg  bid in addition to hs seroquel which I increased to 50mg   -TBI related agitation titrated depakote to 500mg  bid---check level monday  -ritalin stopped- may have potentiated serotonin effects  -increased prozac to 40mg  daily  5. Neuropsych: This patient is not capable of making decisions on her own behalf.  - attention deficits--although her attention is a major factor, cannot risk exacerbating her emotional state---ritalin stopped 6. Skin/Wound Care: monitor PICC site, no decubiti 7. Fluids/Electrolytes/Nutrition: encourage po 8.  ?Seizure taken off Keppra by NS, will monitor 9.  Pulmonary  contusions as well as aspiration pneumonia. 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 Tachycardia with episode of A flutter was on beta blocker as recommended by cardiology Dr Delton SeeNelson   12. Insomnia. seroquel as above  -  improved sleep "hygiene" in general 13. 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

## 2016-03-17 NOTE — Progress Notes (Signed)
03/17/16 1730 nursing CN followed up with Dr. Wynn BankerKirsteins re: order to d/c suicide precaution as recommended by psychiatrist and she said he is ok with it.

## 2016-03-18 ENCOUNTER — Inpatient Hospital Stay (HOSPITAL_COMMUNITY): Payer: Self-pay | Admitting: Occupational Therapy

## 2016-03-18 ENCOUNTER — Inpatient Hospital Stay (HOSPITAL_COMMUNITY): Payer: Medicaid Other | Admitting: Physical Therapy

## 2016-03-18 DIAGNOSIS — S069X3A Unspecified intracranial injury with loss of consciousness of 1 hour to 5 hours 59 minutes, initial encounter: Secondary | ICD-10-CM

## 2016-03-18 MED ORDER — METOPROLOL TARTRATE 12.5 MG HALF TABLET: 12.5000 mg | ORAL_TABLET | Freq: Two times a day (BID) | ORAL | Status: DC

## 2016-03-18 MED ORDER — METOPROLOL TARTRATE 12.5 MG HALF TABLET
12.5000 mg | ORAL_TABLET | Freq: Two times a day (BID) | ORAL | Status: DC
  Administered 2016-03-18 – 2016-03-23 (×11): 12.5 mg via ORAL
  Filled 2016-03-18 (×11): qty 1

## 2016-03-18 NOTE — Progress Notes (Signed)
Physical Therapy Session Note  Patient Details  Name: Theresa SprayHeather Shroff MRN: [id number] Date of Birth: 1991/01/30  Today's Date: 03/18/2016 PT Individual Time: [id number] PT Individual Time Calculation (min): 56 min    Short Term Goals: Week 2:  PT Short Term Goal 1 (Week 2): = LTGs due to anticipated LOS  Skilled Therapeutic Interventions/Progress Updates:    Pt received in w/c with mother present & agreeable to tx. During session pt noted 10/10 headache but denied pain medication. Session focused on gait training, problem solving, working memory, and Teaching laboratory technicianendurance training. Gait training throughout unit without AD & with supervision A. Pt with occasional lateral sway but did not experience any significant loss of balance. Pt performed car transfer with supervision from low sedan simulated height. Pt assembled pipe tree shapes with moderate cuing to select appropriate size pieces and assemble shape to match picture. Pt with low level of frustration during task. Throughout session therapist provided pt with instructions to find path main gym<>ortho gym with pt able to find path with minimal cuing after instructions. Utilized nu-step up to level 6 x 10 minutes for cardiovascular endurance training. Pt completed Berg Balance Test & scored 51/56. Patient demonstrates increased fall risk as noted by score of 51/56 on Berg Balance Scale.  (<36= high risk for falls, close to 100%; 37-45 significant >80%; 46-51 moderate >50%; 52-55 lower >25%). At end of session pt left in bed with all needs within reach & bed alarm set.   Pt with c/o dizziness throughout session & took rest breaks as needed.    Therapy Documentation Precautions:  Precautions Precautions: Fall Precaution Comments: impulsive Restrictions Weight Bearing Restrictions: No  Balance: Balance Balance Assessed: Yes Standardized Balance Assessment Standardized Balance Assessment: Berg Balance Test Berg Balance Test Sit to Stand: Able to stand  without using hands and stabilize independently Standing Unsupported: Able to stand safely 2 minutes Sitting with Back Unsupported but Feet Supported on Floor or Stool: Able to sit safely and securely 2 minutes Stand to Sit: Sits safely with minimal use of hands Transfers: Able to transfer safely, minor use of hands Standing Unsupported with Eyes Closed: Able to stand 10 seconds safely Standing Ubsupported with Feet Together: Able to place feet together independently and stand 1 minute safely From Standing, Reach Forward with Outstretched Arm: Can reach confidently >25 cm (10") From Standing Position, Pick up Object from Floor: Able to pick up shoe safely and easily From Standing Position, Turn to Look Behind Over each Shoulder: Looks behind from both sides and weight shifts well Turn 360 Degrees: Able to turn 360 degrees safely in 4 seconds or less Standing Unsupported, Alternately Place Feet on Step/Stool: Able to stand independently and complete 8 steps >20 seconds Standing Unsupported, One Foot in Front: Able to plae foot ahead of the other independently and hold 30 seconds Standing on One Leg: Tries to lift leg/unable to hold 3 seconds but remains standing independently Total Score: 51   See Function Navigator for Current Functional Status.   Therapy/Group: Individual Therapy  Sandi MariscalVictoria M Verlan Grotz 03/18/2016, 5:09 PM

## 2016-03-18 NOTE — Progress Notes (Signed)
Subjective/Complaints: Office suicide precautions per psychiatry. Patient doing well. Mother at bedside. She notes that she has decreased hearing in the right ear. I reviewed CT scan showing mastoid opacification, also complains that she feels some fluid coming out of her ear at times. She is wondering if it is blood. She does not feel any right now. No stains on pillow noted   Review systems: Denies shortness of breath, nausea, vomiting, diarrhea or constipation. Limited by cognition, poor attention and perseveration, no suicidal plan  Objective: Vital Signs: Blood pressure (!) 98/53, pulse 73, temperature 98.3 F (36.8 C), temperature source Oral, resp. rate 20, height 6\' 2"  (1.88 m), weight 89.8 kg (197 lb 15.6 oz), last menstrual period 02/29/2016, SpO2 99 %. No results found. No results found for this or any previous visit (from the past 72 hour(s)).    General: No acute distress ENT:  Right ear appears normal external canal looks clean , no dried blood or other fluid noted, no stain on pillowcase Mood and affect are appropriate Heart: Regular rate and rhythm no rubs murmurs or extra sounds Lungs: Clear to auscultation, breathing unlabored, no rales or wheezes Abdomen: Positive bowel sounds, soft nontender to palpation, nondistended Extremities: No clubbing, cyanosis, or edema Skin: No evidence of breakdown, no evidence of rash, ecchymosis behind the right ear Neurologic: Cranial nerves II through XII intact except for hearing right ear (dried blood however in ext canal).  motor strength is 5/5 in bilateral deltoid, bicep, tricep, grip, hip flexor, knee extensors, ankle dorsiflexor and plantar flexor Sensory exam normal sensation to light touch and proprioception in bilateral upper and lower extremities Cerebellar exam normal finger to nose to finger as well as heel to shin in bilateral upper and lower extremities Pt impulsive, easily distracted/attention waxes and wanes.  Tearful/emotional. Not agitated. Needs frequent redirection. perseverative Musculoskeletal: Full range of motion in all 4 extremities. No joint swelling   Assessment/Plan: 1. 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 Physiatrist is providing close team supervision and 24 hour management of active medical problems listed below. Physiatrist and rehab team continue to assess barriers to discharge/monitor patient progress toward functional and medical goals. FIM: Function - Bathing Position: Shower Body parts bathed by patient: Right arm, Left arm, Chest, Abdomen, Front perineal area, Buttocks, Right upper leg, Right lower leg, Left upper leg, Left lower leg Body parts bathed by helper: Back Bathing not applicable: Back Assist Level: Supervision or verbal cues  Function- Upper Body Dressing/Undressing What is the patient wearing?: Bra, Pull over shirt/dress Bra - Perfomed by patient: Thread/unthread right bra strap, Thread/unthread left bra strap, Hook/unhook bra (pull down sports bra) Bra - Perfomed by helper: Hook/unhook bra (pull down sports bra) Pull over shirt/dress - Perfomed by patient: Thread/unthread right sleeve, Put head through opening, Thread/unthread left sleeve, Pull shirt over trunk Assist Level: Supervision or verbal cues Function - Lower Body Dressing/Undressing What is the patient wearing?: Underwear, Pants, Socks, Shoes Position: Sitting EOB Underwear - Performed by patient: Thread/unthread right underwear leg, Thread/unthread left underwear leg, Pull underwear up/down Pants- Performed by patient: Thread/unthread right pants leg, Thread/unthread left pants leg, Pull pants up/down Pants- Performed by helper: Thread/unthread right pants leg, Thread/unthread left pants leg (pt with severe headache and requested  A) Non-skid slipper socks- Performed by patient: Don/doff right sock, Don/doff left sock Socks - Performed by patient: Don/doff right  sock, Don/doff left sock Socks - Performed by helper: Don/doff right sock Shoes - Performed by patient: Don/doff right shoe, Don/doff left shoe Shoes - Performed by helper: Don/doff right shoe Assist for footwear: Setup Assist for lower body dressing: Set up, Supervision or verbal cues Set up : To obtain clothing/put away  Function - Toileting Toileting steps completed by patient: Adjust clothing prior to toileting, Performs perineal hygiene, Adjust clothing after toileting Toileting steps completed by helper: Performs perineal hygiene (per Doylene CanardSarah Younts, NT report) Toileting Assistive Devices: Grab bar or rail Assist level: Supervision or verbal cues  Function - ArchivistToilet Transfers Toilet transfer assistive device: Grab bar Assist level to toilet: Supervision or verbal cues Assist level from toilet: Supervision or verbal cues Assist level to bedside commode (at bedside): Moderate assist (Pt 50 - 74%/lift or lower) Assist level from bedside commode (at bedside): Moderate assist (Pt 50 - 74%/lift or lower)  Function - Chair/bed transfer Chair/bed transfer method: Ambulatory Chair/bed transfer assist level: Supervision or verbal cues Chair/bed transfer assistive device: Armrests Chair/bed transfer details: Verbal cues for precautions/safety  Function - Locomotion: Wheelchair Will patient use wheelchair at discharge?: No Function - Locomotion: Ambulation Assistive device: No device Max distance: 200 ft Assist level: Supervision or verbal cues Assist level: Supervision or verbal cues Assist level: Supervision or verbal cues Assist level: Supervision or verbal cues Assist level: Touching or steadying assistance (Pt > 75%)  Function - Comprehension Comprehension: Auditory Comprehension assist level: Understands basic 50 - 74% of the time/ requires  cueing 25 - 49% of the time  Function - Expression Expression: Verbal Expression assist level: Expresses basic 50 - 74% of the time/requires cueing 25 - 49% of the time. Needs to repeat parts of sentences.  Function - Social Interaction Social Interaction assist level: Interacts appropriately 25 - 49% of time - Needs frequent redirection.  Function - Problem Solving Problem solving assist level: Solves basic 25 - 49% of the time - needs direction more than half the time to initiate, plan or complete simple activities  Function - Memory Memory assist level: Recognizes or recalls less than 25% of the time/requires cueing greater than 75% of the time Patient normally able to recall (first 3 days only): That he or she is in a hospital   Medical Problem List and Plan: 1. 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 Family involved, d/c planned for 8/25, cont CIR PT, OT, SLP, Neuropsych   -likely increased agitation and anxiety yesterday related to ritalin  -spoke at length today with patient and separately with mother.  -continue to work on schedule and calm environment/provide education as needed. 2. DVT Prophylaxis/Anticoagulation: Mechanical: Sequential compression devices, below knee Bilateral lower extremities 3. Pain Management: oxycodone 5mg  Q4H prn 4. Mood: emotional lability--improved appreciate psych consult  -continue psychosocial support, family ed  -TBI related agitation, added day time seroquel 25mg  bid in addition to hs seroquel which I increased to 50mg   -TBI related agitation titrated depakote to 500mg  bid---check level monday  -ritalin stopped- may have potentiated serotonin effects  -increased prozac to 40mg  daily  5. Neuropsych: This patient is not capable of making decisions on her own behalf.  - attention deficits--although her attention is a major factor, cannot risk exacerbating her emotional state---ritalin  stopped 6. Skin/Wound Care: monitor PICC site, no decubiti 7. Fluids/Electrolytes/Nutrition: encourage po 8.  ?Seizure taken off Keppra by NS, will monitor 9.  Pulmonary contusions as well as aspiration pneumonia. 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 Tachycardia with episode of A flutter was on beta blocker as recommended by cardiology Dr Delton See   12. Insomnia. seroquel as above  -improved sleep "hygiene" in general 13. 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

## 2016-03-18 NOTE — Plan of Care (Signed)
Problem: RH BOWEL ELIMINATION Goal: RH STG MANAGE BOWEL WITH ASSISTANCE STG Manage Bowel with mod I Assistance.  Outcome: Not Progressing LBM 8/15; refuses intervention at this time claims she will wait; poor appetite at times

## 2016-03-18 NOTE — Progress Notes (Signed)
Patient complained of ear "popping" and discomfort at beginning of shift.  Medicated with Oxycodone as ordered with relief stated.  Patient also states she feels as if her ear is full of fluid and needs to be "flushed out".  Requested a Q-tip multiple times to "try and get the gunk out of my ear".  Encouraged patient not to stick anything inside her ear canal and that nursing will relay her concerns to her MD.  Patient slept well remainder of shift.  No family members in room.  Denied thoughts of hurting herself when asked.  No other complaints voiced.  Took all medications without difficulty.    Kelli HopeBarber, Manasseh Pittsley M

## 2016-03-18 NOTE — Progress Notes (Signed)
Occupational Therapy Session Note  Patient Details  Name: Tateanna Bach MRN: [id number] Date of Birth: [birth year]  Today's Date: 03/18/2016 OT Individual Time:  -    1355-1450   (55 min)   Short Term Goals: Week 1:  OT Short Term Goal 1 (Week 1): Pt will complete grooming standing at sink level with min assistance for dynamic balance. OT Short Term Goal 1 - Progress (Week 1): Met OT Short Term Goal 2 (Week 1): Pt will tolerate 30 minutes of therapeutic intervention with no emotional outbursts, as appropriate. OT Short Term Goal 2 - Progress (Week 1): Progressing toward goal OT Short Term Goal 3 (Week 1): Pt will don socks and shoes with supervision for increase in LB dressing independence. OT Short Term Goal 3 - Progress (Week 1): Met OT Short Term Goal 4 (Week 1): Pt will attend to 3 minutes of cognitive task with zero need for redirection. OT Short Term Goal 4 - Progress (Week 1): Progressing toward goal Week 2:  OT Short Term Goal 1 (Week 2): STGs = LTGs  Skilled Therapeutic Interventions/Progress Updates:    Pt ambulated with min to SBA to BI gym.  Engaged in visual spatial and perceptual tasks, dynamic balance, attention, activity tolerance.  Performed visul spatial task for  6 min 15 seconds wit good sustained attention.  Did card activity of War with no problems.  Did Trail B sample in 30 seconds with increased initial understanding of activity.   Ambulated to kitchen with simple cooking activity.  Provided mod cues for heel toe stride during mobility.  Pt needed verbal  Cues for tasks initiation.  She was able to follow through with task with mod assist for initiation and attention.  She become  dizzzy during standing and swaying posteriorly OT able assist pt to sitting.  Pt stated 4 or 5 times during session about the fact that she had just awaken  4 days ago,  ad a tube down her throat for eating and drinking.    Pt complained of headache about half way through session and asked RN  for med.  Ambulated back to room and left in wc with Mom present.   Therapy Documentation Precautions:  Precautions Precautions: Fall Precaution Comments: impulsive Restrictions Weight Bearing Restrictions: No    Pain: Pain Assessment Pain Assessment: 0-10 Pain Score: 0-No pain Pain Type: Acute pain Pain Location: Head Pain Descriptors / Indicators: Aching Pain Onset: On-going Pain Intervention(s): Medication (See eMAR) ADL: ADL Where Assessed-Upper Body Bathing:  (sitting on tub bench) ADL Comments: see functional navigator Exercises:   Other Treatments:    See Function Navigator for Current Functional Status.   Therapy/Group: Individual Therapy  Lisa Roca 03/18/2016, 12:52 PM

## 2016-03-19 ENCOUNTER — Inpatient Hospital Stay (HOSPITAL_COMMUNITY): Payer: Self-pay | Admitting: Physical Therapy

## 2016-03-19 ENCOUNTER — Inpatient Hospital Stay (HOSPITAL_COMMUNITY): Payer: Medicaid Other | Admitting: Speech Pathology

## 2016-03-19 ENCOUNTER — Inpatient Hospital Stay (HOSPITAL_COMMUNITY): Payer: Medicaid Other | Admitting: Occupational Therapy

## 2016-03-19 LAB — BASIC METABOLIC PANEL
Anion gap: 8 (ref 5–15)
BUN: 10 mg/dL (ref 6–20)
CALCIUM: 9 mg/dL (ref 8.9–10.3)
CHLORIDE: 109 mmol/L (ref 101–111)
CO2: 24 mmol/L (ref 22–32)
CREATININE: 0.76 mg/dL (ref 0.44–1.00)
GFR calc non Af Amer: >60 mL/min (ref >60)
GLUCOSE: 87 mg/dL (ref 65–99)
Potassium: 3.9 mmol/L (ref 3.5–5.1)
Sodium: 141 mmol/L (ref 135–145)

## 2016-03-19 LAB — HEPATIC FUNCTION PANEL
ALT: 14 U/L (ref 14–54)
AST: 23 U/L (ref 15–41)
Albumin: 3.6 g/dL (ref 3.5–5.0)
Alkaline Phosphatase: 71 U/L (ref 38–126)
BILIRUBIN TOTAL: 0.4 mg/dL (ref 0.3–1.2)
Total Protein: 6.1 g/dL — ABNORMAL LOW (ref 6.5–8.1)

## 2016-03-19 LAB — CBC
HCT: 36.3 % (ref 36.0–46.0)
Hemoglobin: 11.6 g/dL — ABNORMAL LOW (ref 12.0–15.0)
MCH: 27.5 pg (ref 26.0–34.0)
MCHC: 32 g/dL (ref 30.0–36.0)
MCV: 86 fL (ref 78.0–100.0)
PLATELETS: 301 10*3/uL (ref 150–400)
RBC: 4.22 MIL/uL (ref 3.87–5.11)
RDW: 13.5 % (ref 11.5–15.5)
WBC: 4.3 10*3/uL (ref 4.0–10.5)

## 2016-03-19 LAB — VALPROIC ACID LEVEL: VALPROIC ACID LVL: 73 ug/mL (ref 50.0–100.0)

## 2016-03-19 NOTE — Progress Notes (Signed)
Speech Language Pathology Daily Session Note  Patient Details  Name: Theresa Henderson MRN: [id number] Date of Birth: 11/21/1990  Today's Date: 03/19/2016 SLP Individual Time: 0930-1030 SLP Individual Time Calculation (min): 60 min   Short Term Goals: Week 2: SLP Short Term Goal 1 (Week 2): Patient will consume current diet without overt s/s of aspiration with supervision verbal cues for use of small bites/sips and a slow rate of self-feeding.  SLP Short Term Goal 2 (Week 2): Patient will demonstrate efficient mastication and oral clearance of regular textures without overt s/s of aspiration with supervision verbal cues over 2 consecutive sessions prior to upgrade.  SLP Short Term Goal 3 (Week 2): Patient will decrease perseveration and will switch topic of conversation with no more than 3 attempts for redirection from clincian in 50% of opportunities.  SLP Short Term Goal 4 (Week 2): Patient will demonstrate sustained attention to a functional task for 2 minutes with Mod A verbal cues for redirection.  SLP Short Term Goal 5 (Week 2): Patient will demonstrate functional problem solving for basic and familair tasks with Mod A verbal cues for redirection.  SLP Short Term Goal 6 (Week 2): Patient will recall 1 event from a previous therapy session with Mod A question and verbal cues.   Skilled Therapeutic Interventions: Skilled treatment session focused on cognitive and dysphagia goals. SLP facilitated session with skilled observation of Dys. 3 textures with thin liquids via straw. Patient with intermittent overt s/s of aspiration throughout the meal, suspect due to decreased attention to bolus and verbosity. Recommend patient continue current diet of Dys. 3 textures with thin liquids via cup. Patient initially demonstrated selective attention, especially during a structured task. However, once the structure was removed, patient demonstrated increased verbosity and tangential language. Overall, patient  more socially appropriate this session. Patient left supine in bed with alarm on. Continue with current plan of care.    Function:  Eating Eating   Modified Consistency Diet: Yes Eating Assist Level: Supervision or verbal cues           Cognition Comprehension Comprehension assist level: Understands basic 90% of the time/cues < 10% of the time  Expression   Expression assist level: Expresses basic 90% of the time/requires cueing < 10% of the time.  Social Interaction Social Interaction assist level: Interacts appropriately 90% of the time - Needs monitoring or encouragement for participation or interaction.  Problem Solving Problem solving assist level: Solves basic 75 - 89% of the time/requires cueing 10 - 24% of the time  Memory Memory assist level: Recognizes or recalls 75 - 89% of the time/requires cueing 10 - 24% of the time    Pain No/Denies Pain   Therapy/Group: Individual Therapy  Theresa Henderson 03/19/2016, 12:16 PM

## 2016-03-19 NOTE — Progress Notes (Signed)
Occupational Therapy Session Note  Patient Details  Name: Theresa SprayHeather Henderson MRN: [id number] Date of Birth: 05/19/1991  Today's Date: 03/19/2016 OT Individual Time: [id number] and 1300-1357 OT Individual Time Calculation (min): 58 min and 57 min    Short Term Goals: Week 2:  OT Short Term Goal 1 (Week 2): STGs = LTGs  Skilled Therapeutic Interventions/Progress Updates:    Session 1: Upon entering the room, pt supine in bed with no c/o pain this session. Pt verbalized, "Please tell me you are hear to help me shower." Pt ambulated in room without use of AE and close supervision this session for short distances. Pt obtaining all clothing items and needed items for bathing and dressing with close supervision. Pt performed bathing at shower level while seated on shower seat. Pt very perseverative this session on events that occurred over the weekend. Pt requiring max verbal cues to redirect during session. Pt dressing self while seated on EOB with close supervision as well. Pt remained in bed at end of session and requesting medications for RN. RN notified. Bed alarm activated and call bell within reach upon exiting the room.   Session 2: Upon entering the room, pt seated on EOB with her cousin and mother present in the room. Mother providing supervision for meal but also distracting pt while she attempts to eat secondary to continuous conversation. After eating,pt given paper and pen to write items she wishes to locate at gift shop this session. Pt ambulated with supervision - steady assist and no use of AD onto elevator and down to gift shop. Pt arriving in gift shop and addressing her list. Pt asking sales clerk for help locating and looking at items in gift shop that she was interested in without cues to do so. After standing in shop for ~ 10 minutes, OT seated for rest break. Pt needing max multimodal cues to navigate self back to rehab floor and finally room at end of session. Pt returned to supine  secondary to fatigue. Call bell and all needed items within reach upon exiting the room.   Therapy Documentation Precautions:  Precautions Precautions: Fall Precaution Comments: impulsive Restrictions Weight Bearing Restrictions: No General:   Vital Signs: Therapy Vitals Temp: 97.5 F (36.4 C) Temp Source: Oral Pulse Rate: 73 Resp: 18 BP: 110/61 Patient Position (if appropriate): Lying Oxygen Therapy SpO2: 100 % O2 Device: Not Delivered Pain:   ADL: ADL Where Assessed-Upper Body Bathing:  (sitting on tub bench) ADL Comments: see functional navigator  See Function Navigator for Current Functional Status.   Therapy/Group: Individual Therapy  Lowella Gripittman, Maile Linford L 03/19/2016, 6:49 PM

## 2016-03-19 NOTE — Progress Notes (Signed)
Physical Therapy Session Note  Patient Details  Name: Theresa SprayHeather Henderson MRN: [id number] Date of Birth: 1991-05-24  Today's Date: 03/19/2016 PT Individual Time: 1400-1440 PT Individual Time Calculation (min): 40 min    Short Term Goals: Week 2:  PT Short Term Goal 1 (Week 2): = LTGs due to anticipated LOS  Skilled Therapeutic Interventions/Progress Updates:   Pt received seated in bed, denies pain and agreeable to treatment. Bed mobility modI; dons shoes with setupA. Ambulation to/from gym and while pushing tv cart with S due to occasional mild LOBs and unsteadiness, increased when pt having a conversation d/t cognitive dual task. Pt engaged in Wii dance in standing for dynamic standing balance, attention, coordination, cognitive dual task with standing balance. Requires one seated rest break due to fatigue. Upon returning to room pt perseverative on finding her mom, requests to look for her and uses w/c with B feet to propel to/from family room, and attempted to call mom on the phone at the nurses station. Unwilling to go back to bed despite education in safety plan and therapist gently encouraging pt to return to bed. Pt's primary RN and NT off the unit; covering RN agreeable to sit in line of sight of pt. Remained seated in w/c at end of session, all needs in reach.   Therapy Documentation Precautions:  Precautions Precautions: Fall Precaution Comments: impulsive Restrictions Weight Bearing Restrictions: No   See Function Navigator for Current Functional Status.   Therapy/Group: Individual Therapy  Vista Lawmanlizabeth J Tygielski 03/19/2016, 2:50 PM

## 2016-03-19 NOTE — Progress Notes (Addendum)
Physical Therapy Session Note  Patient Details  Name: Theresa Henderson Deacon MRN: [id number] Date of Birth: 1991/05/04  Today's Date: 03/19/2016 PT Individual Time: 1105-1210 PT Individual Time Calculation (min): 65 min    Short Term Goals: Week 2:  PT Short Term Goal 1 (Week 2): = LTGs due to anticipated LOS  Skilled Therapeutic Interventions/Progress Updates:  Pt received asleep in bed; easily awakened.  Pt denied pain and did not report dizziness when rolling in bed or transitioning supine > sit upright when assessing BP change.  Supine: 103/53, Sitting: 102/60.  Pt reporting need to toilet.  Pt donned shoes with supervision and ambulated to toilet with supervision.  Required min A while in bathroom due to one episode of anterior LOB while reaching down to doff pants.   Once in gym continued vestibular assessment with patient.  Performed re-assessment of slow VOR and HIT with similar results.  Continued assessment with Dynamic Visual Acuity with pt demonstrating a 6 line difference between static and dynamic visual acuity.  Transitioned to MCTSIB testing on BIODEX x 2 reps with first testing condition allowing pt to have visual feedback of dot tracing of COG on screen and then removing tracing to minimize distraction.  Results of each condition below:  With Tracing sway index: 5.8 1.26 1.38 2.95  Worst sway during 3rd condition: eyes open, foam surface-vision dominant, vestibular secondary  Without tracing sway index: 1.14 2.04 2.59 3.84 Worst sway index during 3rd condition: eyes open, foam surface.  Pt also present with worse performance when tracing removed.    Pt continues to present with signs and symptoms most consistent with hypofunction and impaired gaze stabilization: unilateral vs. Bilateral.  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  Recommending use of adaptation and gaze stabilization  exercises-x 1 viewing and eye-head shifts.  At end of session pt returned to room and left seated EOB with NT present to supervise.        Therapy Documentation Precautions:  Precautions Precautions: Fall Precaution Comments: impulsive Restrictions Weight Bearing Restrictions: No Vital Signs: Therapy Vitals BP: 106/64   See Function Navigator for Current Functional Status.   Therapy/Group: Individual Therapy  Edman CircleHall, Dhani Dannemiller [facility] 03/19/2016, 12:58 PM

## 2016-03-19 NOTE — Progress Notes (Signed)
Subjective/Complaints: Patient slept well. Had a good day with therapy today. Denies any new issues. Did not mention her right ear once this morning despite me leading her in that direction with questioning this am.   Review systems: Denies shortness of breath, nausea, vomiting, diarrhea or constipation. Limited by cognition, poor attention and perseveration, no suicidal plan  Objective: Vital Signs: Blood pressure (!) 98/54, pulse 76, temperature 98.1 F (36.7 C), temperature source Oral, resp. rate 18, height 6\' 2"  (1.88 m), weight 94.2 kg (207 lb 9.6 oz), last menstrual period 02/29/2016, SpO2 98 %. No results found. No results found for this or any previous visit (from the past 72 hour(s)).    General: No acute distress ENT:  Right ear appears normal external canal looks clean , no dried blood or other fluid noted, no stain on pillowcase Mood and affect are appropriate Heart: Regular rate and rhythm no rubs murmurs or extra sounds Lungs: Clear to auscultation, breathing unlabored, no rales or wheezes Abdomen: Positive bowel sounds, soft nontender to palpation, nondistended Extremities: No clubbing, cyanosis, or edema Skin: No evidence of breakdown, no evidence of rash, ecchymosis behind the right ear Neurologic: Cranial nerves II through XII intact except for hearing right ear (dried blood however in ext canal).  motor strength is 5/5 in bilateral deltoid, bicep, tricep, grip, hip flexor, knee extensors, ankle dorsiflexor and plantar flexor Sensory exam normal sensation to light touch and proprioception in bilateral upper and lower extremities Cerebellar exam normal finger to nose to finger as well as heel to shin in bilateral upper and lower extremities Pt impulsive, easily distracted/attention waxes and wanes. Tearful/emotional. Not agitated. Needs frequent redirection. perseverative Musculoskeletal: Full range of motion in all 4 extremities. No joint  swelling   Assessment/Plan: 1. 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 Physiatrist is providing close team supervision and 24 hour management of active medical problems listed below. Physiatrist and rehab team continue to assess barriers to discharge/monitor patient progress toward functional and medical goals. FIM: Function - Bathing Position: Shower Body parts bathed by patient: Right arm, Left arm, Chest, Abdomen, Front perineal area, Buttocks, Right upper leg, Right lower leg, Left upper leg, Left lower leg Body parts bathed by helper: Back Bathing not applicable: Back Assist Level: Supervision or verbal cues  Function- Upper Body Dressing/Undressing What is the patient wearing?: Bra, Pull over shirt/dress Bra - Perfomed by patient: Thread/unthread right bra strap, Thread/unthread left bra strap, Hook/unhook bra (pull down sports bra) Bra - Perfomed by helper: Hook/unhook bra (pull down sports bra) Pull over shirt/dress - Perfomed by patient: Thread/unthread right sleeve, Put head through opening, Thread/unthread left sleeve, Pull shirt over trunk Assist Level: Supervision or verbal cues Function - Lower Body Dressing/Undressing What is the patient wearing?: Underwear, Pants, Socks, Shoes Position: Sitting EOB Underwear - Performed by patient: Thread/unthread right underwear leg, Thread/unthread left underwear leg, Pull underwear up/down Pants- Performed by patient: Thread/unthread right pants leg, Thread/unthread left pants leg, Pull pants up/down Pants- Performed by helper: Thread/unthread right pants leg, Thread/unthread left pants leg (pt with severe headache and requested A) Non-skid slipper socks- Performed by patient: Don/doff right sock, Don/doff left sock Socks - Performed by patient: Don/doff right sock, Don/doff  left sock Socks - Performed by helper: Don/doff right sock Shoes - Performed by patient: Don/doff right shoe, Don/doff left shoe Shoes - Performed by helper:  Don/doff right shoe Assist for footwear: Setup Assist for lower body dressing: Set up, Supervision or verbal cues Set up : To obtain clothing/put away  Function - Toileting Toileting steps completed by patient: Adjust clothing prior to toileting, Performs perineal hygiene, Adjust clothing after toileting Toileting steps completed by helper: Performs perineal hygiene (per Doylene CanardSarah Younts, NT report) Toileting Assistive Devices: Grab bar or rail Assist level: Supervision or verbal cues  Function - ArchivistToilet Transfers Toilet transfer assistive device: Grab bar Assist level to toilet: Supervision or verbal cues Assist level from toilet: Supervision or verbal cues Assist level to bedside commode (at bedside): Moderate assist (Pt 50 - 74%/lift or lower) Assist level from bedside commode (at bedside): Moderate assist (Pt 50 - 74%/lift or lower)  Function - Chair/bed transfer Chair/bed transfer method: Ambulatory Chair/bed transfer assist level: Supervision or verbal cues Chair/bed transfer assistive device: Armrests Chair/bed transfer details: Verbal cues for precautions/safety  Function - Locomotion: Wheelchair Will patient use wheelchair at discharge?: No Function - Locomotion: Ambulation Assistive device: No device Max distance: 200 ft Assist level: Supervision or verbal cues Assist level: Supervision or verbal cues Assist level: Supervision or verbal cues Assist level: Supervision or verbal cues Assist level: Touching or steadying assistance (Pt > 75%)  Function - Comprehension Comprehension: Auditory Comprehension assist level: Understands basic 90% of the time/cues < 10% of the time  Function - Expression Expression: Verbal Expression assist level: Expresses basic 90% of the time/requires cueing < 10% of the  time.  Function - Social Interaction Social Interaction assist level: Interacts appropriately 90% of the time - Needs monitoring or encouragement for participation or interaction.  Function - Problem Solving Problem solving assist level: Solves basic 75 - 89% of the time/requires cueing 10 - 24% of the time  Function - Memory Memory assist level: Recognizes or recalls 75 - 89% of the time/requires cueing 10 - 24% of the time Patient normally able to recall (first 3 days only): Current season   Medical Problem List and Plan: 1. 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 Family involved, d/c planned for 8/25, cont CIR PT, OT, SLP, Neuropsych     2. DVT Prophylaxis/Anticoagulation: Mechanical: Sequential compression devices, below knee Bilateral lower extremities 3. Pain Management: oxycodone 5mg  Q4H prn 4. Mood: emotional lability--improved appreciate psych consult  -continue psychosocial support, family ed  -TBI related agitation, continue day time seroquel 25mg  bid in addition to hs seroquel which I increased to 50mg   -TBI related agitation titrated depakote to 500mg  bid-- level pending today  -ritalin stopped- may have potentiated serotonin effects  -increased prozac to 40mg  daily  5. Neuropsych: This patient is not capable of making decisions on her own behalf.  - attention deficits--although her attention is a major factor, cannot risk exacerbating her emotional state---ritalin stopped 6. Skin/Wound Care: monitor PICC site, no decubiti 7. Fluids/Electrolytes/Nutrition: encourage po 8.  ?Seizure taken off Keppra by NS, will monitor 9.  Pulmonary contusions as well as aspiration pneumonia. 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 Tachycardia with episode of A flutter was on beta blocker as recommended by cardiology Dr Delton SeeNelson   12. Insomnia. seroquel as above  -improved sleep "hygiene" in  general 13. 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

## 2016-03-19 NOTE — Plan of Care (Signed)
Problem: RH BOWEL ELIMINATION Goal: RH STG MANAGE BOWEL W/MEDICATION W/ASSISTANCE STG Manage Bowel with Medication with mod I Assistance.  Outcome: Not Progressing LBM 03/13/16

## 2016-03-20 ENCOUNTER — Inpatient Hospital Stay (HOSPITAL_COMMUNITY): Payer: Medicaid Other | Admitting: Occupational Therapy

## 2016-03-20 ENCOUNTER — Inpatient Hospital Stay (HOSPITAL_COMMUNITY): Payer: Self-pay | Admitting: Occupational Therapy

## 2016-03-20 ENCOUNTER — Inpatient Hospital Stay (HOSPITAL_COMMUNITY): Payer: Self-pay | Admitting: Physical Therapy

## 2016-03-20 ENCOUNTER — Inpatient Hospital Stay (HOSPITAL_COMMUNITY): Payer: Medicaid Other | Admitting: Speech Pathology

## 2016-03-20 ENCOUNTER — Inpatient Hospital Stay (HOSPITAL_COMMUNITY): Payer: Medicaid Other | Admitting: *Deleted

## 2016-03-20 NOTE — Progress Notes (Signed)
Physical Therapy Session Note  Patient Details  Name: Theresa SprayHeather Rudnick MRN: [id number] Date of Birth: Sep 09, 1990  Today's Date: 03/20/2016 PT Individual Time: [id number] PT Individual Time Calculation (min): 57 min    Short Term Goals: Week 2:  PT Short Term Goal 1 (Week 2): = LTGs due to anticipated LOS  Skilled Therapeutic Interventions/Progress Updates:  Pt received in bed with mother and cousin present.  Pt reporting significant R foot pain.  Upon sitting EOB pt bilat lower leg noted to be very edematous; pt kept stating, "it's because of the bruise on my foot."  Alerted RN to edema and pain; RN to assess and pt willing to continue with therapy.  Pt performed all transfers and ambulation to gym with supervision.  In gym performed vestibular adaptation training with focus on x 1 viewing training first seated on solid surface with target 3 feet away in pitch and yaw planes x 30 seconds each progressing to seated on disk without foot support >> seated on Bosu without foot support performing same exercise to decrease dependence on somatosensory input.  Also performed training with eye-head shifting in sitting in pitch and yaw planes.  Transitioned to static standing and repeated x 1 viewing exercise and eye-head shifting exercise with min A.  Progressed to standing with one foot placed forwards on balance disc to increase balance challenge by decreasing somatosensory information-required min-mod A to maintain balance.  In hallway performed dynamic gait with head turns with ball passing to L and R.  Pt reporting increased foot pain and fatigue.  Returned to room and pt returned to bed to rest.  Provided pt and mother with hand out of x 1 viewing basic sitting and standing exercises as well as target cards, each exercise explained to pt's mother who verbalized understanding.  Will continue to incorporate adaptation in to daily therapy.  Therapy Documentation Precautions:  Precautions Precautions:  Fall Precaution Comments: impulsive Restrictions Weight Bearing Restrictions: No Vital Signs: Therapy Vitals Temp: 97.8 F (36.6 C) Temp Source: Oral Pulse Rate: 76 Resp: 18 BP: 121/65 Patient Position (if appropriate): Lying Oxygen Therapy SpO2: 100 % O2 Device: Not Delivered Pain: Pain Assessment Pain Assessment: No/denies pain Pain Score: 6  Pain Type: Acute pain Pain Location: Foot Pain Orientation: Right Pain Descriptors / Indicators: Sore;Tender Pain Onset: On-going Pain Intervention(s): RN made aware    See Function Navigator for Current Functional Status.   Therapy/Group: Individual Therapy  Edman CircleHall, Tieler Cournoyer [facility] 03/20/2016, 4:32 PM

## 2016-03-20 NOTE — Progress Notes (Signed)
Occupational Therapy Session Note  Patient Details  Name: Theresa Henderson MRN: [id number] Date of Birth: 1991-04-29  Today's Date: 03/20/2016 OT Individual Time: 1415-1455 OT Individual Time Calculation (min): 40 min     Short Term Goals: Week 2:  OT Short Term Goal 1 (Week 2): STGs = LTGs  Skilled Therapeutic Interventions/Progress Updates:    Treatment session with focus on active participation in table top task.  Initially upon arrival pt asleep in bed reporting extreme nausea and declining participation in any activity even at bed side.  Provided pt with short brain break and returned with "Spot it", familiar card matching activity.  Pt reports willing to complete task if able to remain in bed.  Pt reports no recollection of game but after simple explanation, pt able to complete without additional cues.  Increased challenge to matching 3 cards from field of 9 with good participation.  Pt reports mild headache at end of session, notified RN.  Therapy Documentation Precautions:  Precautions Precautions: Fall Precaution Comments: impulsive Restrictions Weight Bearing Restrictions: No Pain: Pt reports mild headache at end of session, RN aware  See Function Navigator for Current Functional Status.   Therapy/Group: Individual Therapy  Rosalio LoudHOXIE, Adiah Guereca 03/20/2016, 3:15 PM

## 2016-03-20 NOTE — Progress Notes (Signed)
Occupational Therapy Session Note  Patient Details  Name: Theresa Henderson MRN: [id number] Date of Birth: 1990/10/08  Today's Date: 03/20/2016 OT Individual Time: 8978-4784 OT Individual Time Calculation (min): 60 min     Short Term Goals:Week 1:  OT Short Term Goal 1 (Week 1): Pt will complete grooming standing at sink level with min assistance for dynamic balance. OT Short Term Goal 1 - Progress (Week 1): Met OT Short Term Goal 2 (Week 1): Pt will tolerate 30 minutes of therapeutic intervention with no emotional outbursts, as appropriate. OT Short Term Goal 2 - Progress (Week 1): Progressing toward goal OT Short Term Goal 3 (Week 1): Pt will don socks and shoes with supervision for increase in LB dressing independence. OT Short Term Goal 3 - Progress (Week 1): Met OT Short Term Goal 4 (Week 1): Pt will attend to 3 minutes of cognitive task with zero need for redirection. OT Short Term Goal 4 - Progress (Week 1): Progressing toward goal Week 2:  OT Short Term Goal 1 (Week 2): STGs = LTGs  Skilled Therapeutic Interventions/Progress Updates:    Pt seen for skilled OT to facilitate attention to task, memory and problem solving during basic self care tasks. Pt eager to shower. Pt has tendency to ask for help when she is capable of doing it herself. Reviewed with pt that her independence is strongly encouraged and that this clinician would intervene only if necessary for safety. (for example, shaving armpits due to near vision impairment per pt)  Pt agreed but would repeat numerous times, " I will do this so you don't have to".  Pt continues to have decreased insight, perseveration, and very delayed immediate recall. She would tell me something and then repeat it numerous times as if she was telling it to me for the first time. Her dynamic balance continues to improve as she was able to ambulate around the room, pick up items off the floor, put laundry away, and make her bed with S only.   Pt very  concerned about her therapy schedule and what was expected of her. Reviewed general team goals and purpose of therapy sessions. At end of session, attempted to turn on bed alarm but pt adamantly refused saying that she would just sit in recliner instead. When instructed to not get up alone, pt stated she plans to work on her hair and again refused to sit EOB with alarm on. RN, nurse tech, and receptionist made aware to keep close S.   Therapy Documentation Precautions:  Precautions Precautions: Fall Precaution Comments: impulsive Restrictions Weight Bearing Restrictions: No     Pain: Pain Assessment Pain Assessment: No/denies pain ADL:  See Function Navigator for Current Functional Status.   Therapy/Group: Individual Therapy  [place] 03/20/2016, 12:15 PM

## 2016-03-20 NOTE — Progress Notes (Signed)
Occupational Therapy Session Note  Patient Details  Name: Theresa Henderson MRN: [id number] Date of Birth: 03/29/1991  Today's Date: 03/20/2016 OT Individual Time: 1030-1100 OT Individual Time Calculation (min): 30 min     Short Term Goals: Week 2:  OT Short Term Goal 1 (Week 2): STGs = LTGs  Skilled Therapeutic Interventions/Progress Updates:    1:1 Focus functional problem solving, emergent to anticipatory awareness, sequencing and task organization. Pt's 11:00 session was going to be making pizza; focus on this session was to write out the steps to make a pizza. Pt was able to demonstrate selective attention to complete task with min cuing task. Pt required questioning cues to slow down so her instructions could be written down. Pt able to be redirected easily when off track. Pt was able to manage time at end of session to complete toileting, donning shoes and fixing hair before the next therapist arrived. Hand off to next therapist.   Therapy Documentation Precautions:  Precautions Precautions: Fall Precaution Comments: impulsive Restrictions Weight Bearing Restrictions: No Pain: Pain Assessment Pain Assessment: No/denies pain ADL: ADL Where Assessed-Upper Body Bathing:  (sitting on tub bench) ADL Comments: see functional navigator  See Function Navigator for Current Functional Status.   Therapy/Group: Individual Therapy  Roney MansSmith, [name] 03/20/2016, 3:02 PM

## 2016-03-20 NOTE — Progress Notes (Signed)
Speech Language Pathology Daily Session Note  Patient Details  Name: Teodoro SprayHeather Feider MRN: [id number] Date of Birth: [birth year]  Today's Date: 03/20/2016 SLP Individual Time: 1100-1215 SLP Individual Time Calculation (min): 75 min   Short Term Goals: Week 2: SLP Short Term Goal 1 (Week 2): Patient will consume current diet without overt s/s of aspiration with supervision verbal cues for use of small bites/sips and a slow rate of self-feeding.  SLP Short Term Goal 2 (Week 2): Patient will demonstrate efficient mastication and oral clearance of regular textures without overt s/s of aspiration with supervision verbal cues over 2 consecutive sessions prior to upgrade.  SLP Short Term Goal 3 (Week 2): Patient will decrease perseveration and will switch topic of conversation with no more than 3 attempts for redirection from clincian in 50% of opportunities.  SLP Short Term Goal 4 (Week 2): Patient will demonstrate sustained attention to a functional task for 2 minutes with Mod A verbal cues for redirection.  SLP Short Term Goal 5 (Week 2): Patient will demonstrate functional problem solving for basic and familair tasks with Mod A verbal cues for redirection.  SLP Short Term Goal 6 (Week 2): Patient will recall 1 event from a previous therapy session with Mod A question and verbal cues.   Skilled Therapeutic Interventions: Skilled treatment session focused on cognitive and dysphagia goals. SLP facilitated session by providing Min A verbal and question cues for problem solving, sequencing and overall safety during a complex kitchen task. Patient's verbal perseveration and verbosity increased when structure was decreased throughout the session. However, patient redirected with Min-Mod A verbal and question cues. Patient consumed trials of regular textures without overt s/s of aspiration and was Mod I for safety. Recommend trial tray prior to upgrade. Patient left supine in bed with all needs within reach and  bed alarm on. Continue with current plan of care.   Function:  Eating Eating   Modified Consistency Diet: Yes Eating Assist Level: Supervision or verbal cues           Cognition Comprehension Comprehension assist level: Understands basic 90% of the time/cues < 10% of the time  Expression   Expression assist level: Expresses basic 90% of the time/requires cueing < 10% of the time.  Social Interaction Social Interaction assist level: Interacts appropriately 75 - 89% of the time - Needs redirection for appropriate language or to initiate interaction.  Problem Solving Problem solving assist level: Solves basic 50 - 74% of the time/requires cueing 25 - 49% of the time  Memory Memory assist level: Recognizes or recalls 25 - 49% of the time/requires cueing 50 - 75% of the time    Pain Pain Assessment Pain Assessment: No/denies pain  Therapy/Group: Individual Therapy  Haila Dena 03/20/2016, 3:26 PM

## 2016-03-20 NOTE — Progress Notes (Signed)
Recreational Therapy Session Note  Patient Details  Name: Theresa Henderson MRN: [id number] Date of Birth: November 07, 1990 Today's Date: 03/20/2016  Pain: no c/o Skilled Therapeutic Interventions/Progress Updates: Session focused on activity tolerance, ambulation, standing balance, problem solving, sequencing, safety during meal prep activity making pizza.  Pt required close supervision for balance and min cues for cognition.    Therapy/Group: Co-Treatment   Kevon Tench 03/20/2016, 3:35 PM

## 2016-03-20 NOTE — Progress Notes (Signed)
Recreational Therapy Discharge Summary Patient Details  Name: Theresa Henderson MRN: [id number] Date of Birth: 22-Jan-1991 Today's Date: 03/20/2016  Long term goals set: 1  Long term goals met: 1  Comments on progress toward goals: Pt has made good progress toward goal and is ready (and anxious) for discharge home on 8/25.  Pt is discharging at overall supervision level /min cues for TR tasks.  Family has been present throughout LOS and involved in her care.  Reasons for discharge: discharge from hospital Patient/family agrees with progress made and goals achieved: Yes  [name] 03/20/2016, 3:44 PM

## 2016-03-20 NOTE — Progress Notes (Signed)
Subjective/Complaints: Pt concerned about scrape on right foot. Wants to know if it's infected   Review systems: Denies shortness of breath, nausea, vomiting, diarrhea or constipation. Limited by cognition, poor attention and perseveration, no suicidal plan  Objective: Vital Signs: Blood pressure 119/66, pulse 77, temperature 97.8 F (36.6 C), temperature source Oral, resp. rate 18, height _0  (1.88 m), weight 94.3 kg (207 lb 14.3 oz), last menstrual period 02/29/2016, SpO2 100 %. No results found. Results for orders placed or performed during the hospital encounter of 03/08/16 (from the past 72 hour(s))  Basic metabolic panel     Status: None   Collection Time: 03/19/16 10:28 AM  Result Value Ref Range   Sodium 141 135 - 145 mmol/L   Potassium 3.9 3.5 - 5.1 mmol/L   Chloride 109 101 - 111 mmol/L   CO2 24 22 - 32 mmol/L   Glucose, Bld 87 65 - 99 mg/dL   BUN 10 6 - 20 mg/dL   Creatinine, Ser 0.76 0.44 - 1.00 mg/dL   Calcium 9.0 8.9 - 10.3 mg/dL   GFR calc non Af Amer >60 >60 mL/min   GFR calc Af Amer >60 >60 mL/min    Comment: (NOTE) The eGFR has been calculated using the CKD EPI equation. This calculation has not been validated in all clinical situations. eGFR's persistently <60 mL/min signify possible Chronic Kidney Disease.    Anion gap 8 5 - 15  CBC     Status: Abnormal   Collection Time: 03/19/16 10:28 AM  Result Value Ref Range   WBC 4.3 4.0 - 10.5 K/uL   RBC 4.22 3.87 - 5.11 MIL/uL   Hemoglobin 11.6 (L) 12.0 - 15.0 g/dL   HCT 36.3 36.0 - 46.0 %   MCV 86.0 78.0 - 100.0 fL   MCH 27.5 26.0 - 34.0 pg   MCHC 32.0 30.0 - 36.0 g/dL   RDW 13.5 11.5 - 15.5 %   Platelets 301 150 - 400 K/uL  Valproic acid level     Status: None   Collection Time: 03/19/16 10:28 AM  Result Value Ref Range   Valproic Acid Lvl 73 50.0 - 100.0 ug/mL  Hepatic function panel     Status: Abnormal   Collection Time: 03/19/16 12:31 PM  Result Value Ref Range   Total Protein 6.1 (L) 6.5 -  8.1 g/dL   Albumin 3.6 3.5 - 5.0 g/dL   AST 23 15 - 41 U/L   ALT 14 14 - 54 U/L   Alkaline Phosphatase 71 38 - 126 U/L   Total Bilirubin 0.4 0.3 - 1.2 mg/dL   Bilirubin, Direct <0.1 (L) 0.1 - 0.5 mg/dL   Indirect Bilirubin NOT CALCULATED 0.3 - 0.9 mg/dL      General: No acute distress ENT:  Right ear appears normal external canal looks clean , no dried blood or other fluid noted, no stain on pillowcase Mood and affect are appropriate Heart: Regular rate and rhythm no rubs murmurs or extra sounds Lungs: Clear to auscultation, breathing unlabored, no rales or wheezes Abdomen: Positive bowel sounds, soft nontender to palpation, nondistended Extremities: No clubbing, cyanosis, or edema Skin: small scrape on dorsum of right foot/ mild associated swelling Neurologic: Cranial nerves II through XII intact except for hearing right ear (dried blood however in ext canal).  motor strength is 5/5 in bilateral deltoid, bicep, tricep, grip, hip flexor, knee extensors, ankle dorsiflexor and plantar flexor Sensory exam normal sensation to light touch and proprioception in bilateral upper  and lower extremities Cerebellar exam normal finger to nose to finger as well as heel to shin in bilateral upper and lower extremities Pt impulsive, easily distracted/attention waxes and wanes. Tearful/emotional. Not agitated. Needs frequent redirection. perseverative Musculoskeletal: Full range of motion in all 4 extremities. No joint swelling   Assessment/Plan: 1. 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 Physiatrist is providing close team supervision and 24 hour management of active medical problems listed below. Physiatrist and rehab team continue to assess barriers to discharge/monitor patient progress toward functional and medical goals. FIM: Function  - Bathing Position: Shower Body parts bathed by patient: Right arm, Left arm, Chest, Abdomen, Front perineal area, Buttocks, Right upper leg, Right lower leg, Left upper leg, Left lower leg Body parts bathed by helper: Back Bathing not applicable: Back Assist Level: Supervision or verbal cues  Function- Upper Body Dressing/Undressing What is the patient wearing?: Bra, Pull over shirt/dress Bra - Perfomed by patient: Thread/unthread right bra strap, Thread/unthread left bra strap, Hook/unhook bra (pull down sports bra) Bra - Perfomed by helper: Hook/unhook bra (pull down sports bra) Pull over shirt/dress - Perfomed by patient: Thread/unthread right sleeve, Put head through opening, Thread/unthread left sleeve, Pull shirt over trunk Assist Level: Supervision or verbal cues Function - Lower Body Dressing/Undressing What is the patient wearing?: Underwear, Pants, Socks, Shoes Position: Sitting EOB Underwear - Performed by patient: Thread/unthread right underwear leg, Thread/unthread left underwear leg, Pull underwear up/down Pants- Performed by patient: Thread/unthread right pants leg, Thread/unthread left pants leg, Pull pants up/down Pants- Performed by helper: Thread/unthread right pants leg, Thread/unthread left pants leg (pt with severe headache and requested A) Non-skid slipper socks- Performed by patient: Don/doff right sock, Don/doff left sock Socks - Performed by patient: Don/doff right sock, Don/doff left sock Socks - Performed by helper: Don/doff right sock Shoes - Performed by patient: Don/doff right shoe, Don/doff left shoe Shoes - Performed by helper: Don/doff right shoe Assist for footwear: Supervision/touching assist Assist for lower body dressing: Supervision or verbal cues Set up : To obtain clothing/put away  Function - Toileting Toileting steps completed by patient: Adjust clothing prior to toileting, Performs perineal hygiene, Adjust clothing after toileting Toileting  steps completed by helper: Performs perineal hygiene Toileting Assistive Devices: Grab bar or rail Assist level: Supervision or verbal cues  Function - Toilet Transfers Toilet transfer assistive device: Grab bar Assist level to toilet: Supervision or verbal cues Assist level from toilet: Supervision or verbal cues Assist level to bedside commode (at bedside): Moderate assist (Pt 50 - 74%/lift or lower) Assist level from bedside commode (at bedside): Moderate assist (Pt 50 - 74%/lift or lower)  Function - Chair/bed transfer Chair/bed transfer method: Ambulatory Chair/bed transfer assist level: Supervision or verbal cues Chair/bed transfer assistive device: Armrests Chair/bed transfer details: Verbal cues for precautions/safety  Function - Locomotion: Wheelchair Will patient use wheelchair at discharge?: No Function - Locomotion: Ambulation Assistive device: No device Max distance: 300+ Assist level: Supervision or verbal cues Assist level: Supervision or verbal cues Assist level: Supervision or verbal cues Assist level: Touching or steadying assistance (Pt > 75%) Assist level: Touching or steadying assistance (Pt > 75%)  Function - Comprehension Comprehension: Auditory Comprehension assist level: Understands basic 90% of the time/cues < 10% of the time  Function - Expression Expression: Verbal Expression assist level: Expresses basic 90% of the time/requires cueing < 10% of  the time.  Function - Social Interaction Social Interaction assist level: Interacts appropriately 90% of the time - Needs monitoring or encouragement for participation or interaction.  Function - Problem Solving Problem solving assist level: Solves basic 75 - 89% of the time/requires cueing 10 - 24% of the time  Function - Memory Memory assist level: Recognizes or recalls 75 - 89% of the time/requires cueing 10 - 24% of the time Patient normally able to recall (first 3 days only): Current  season   Medical Problem List and Plan: 1. 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   - cont CIR PT, OT, SLP, Neuropsych   -displaying improved awareness, responds to redirection 2. DVT Prophylaxis/Anticoagulation: Mechanical: Sequential compression devices, below knee Bilateral lower extremities 3. Pain Management: oxycodone 82m Q4H prn 4. Mood: emotional lability--improved appreciate psych consult  -continue psychosocial support, family ed  -TBI related agitation, continue day time seroquel 281mbid in addition to hs seroquel which I increased to 5053m-TBI related agitation titrated depakote to 500m6md-- level pending today  -ritalin stopped which appears to have been trigger for outbursts last week  -increased prozac to 40mg33mly  5. Neuropsych: This patient is not capable of making decisions on her own behalf.  - attention deficits--although her attention is a major factor, cannot risk exacerbating her emotional state---ritalin stopped 6. Skin/Wound Care: local care/bandage to foot---reassured pt 7. Fluids/Electrolytes/Nutrition: encourage po 8.  ?Seizure taken off Keppra by NS, will monitor 9.  Pulmonary contusions as well as aspiration pneumonia. 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 Tachycardia with episode of A flutter was on beta blocker as recommended by cardiology Dr NelsoMeda Coffee. Insomnia. seroquel as above  -improved sleep "hygiene" in general 13. 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

## 2016-03-20 NOTE — Progress Notes (Signed)
Speech Language Pathology Daily Session Note  Patient Details  Name: Theresa SprayHeather Carriero MRN: [id number] Date of Birth: Dec 15, 1990  Today's Date: 03/20/2016 SLP Individual Time: 1530-1545 SLP Individual Time Calculation (min): 15 min  Missed: 15 minutes due to fatigue and report of feeling sick to his stomach    Short Term Goals: Week 2: SLP Short Term Goal 1 (Week 2): Patient will consume current diet without overt s/s of aspiration with supervision verbal cues for use of small bites/sips and a slow rate of self-feeding.  SLP Short Term Goal 2 (Week 2): Patient will demonstrate efficient mastication and oral clearance of regular textures without overt s/s of aspiration with supervision verbal cues over 2 consecutive sessions prior to upgrade.  SLP Short Term Goal 3 (Week 2): Patient will decrease perseveration and will switch topic of conversation with no more than 3 attempts for redirection from clincian in 50% of opportunities.  SLP Short Term Goal 4 (Week 2): Patient will demonstrate sustained attention to a functional task for 2 minutes with Mod A verbal cues for redirection.  SLP Short Term Goal 5 (Week 2): Patient will demonstrate functional problem solving for basic and familair tasks with Mod A verbal cues for redirection.  SLP Short Term Goal 6 (Week 2): Patient will recall 1 event from a previous therapy session with Mod A question and verbal cues.   Skilled Therapeutic Interventions:   Skilled treatment session focused on addressing dysphagia goals. SLP facilitated session by providing skilled observation of patient consuming thin liquids via straw with Mod I for a slow pace and portion control after set-up of a non-distracting environment.   Per primary SLP if patient Mod I then it was recommended to upgrade to intermittent supervision; as a result, signs and orders updated to reflect upgrade.    Function:  Eating Eating   Modified Consistency Diet: Yes Eating Assist Level: More than  reasonable amount of time;Set up assist for   Eating Set Up Assist For: Opening containers       Cognition Comprehension Comprehension assist level: Understands basic 90% of the time/cues < 10% of the time  Expression   Expression assist level: Expresses basic 90% of the time/requires cueing < 10% of the time.  Social Interaction Social Interaction assist level: Interacts appropriately 75 - 89% of the time - Needs redirection for appropriate language or to initiate interaction.  Problem Solving Problem solving assist level: Solves basic 50 - 74% of the time/requires cueing 25 - 49% of the time  Memory Memory assist level: Recognizes or recalls 25 - 49% of the time/requires cueing 50 - 75% of the time    Pain Pain Assessment Pain Assessment: No/denies pain  Therapy/Group: Individual Therapy  Charlane FerrettiMelissa Loriana Samad, M.A., CCC-SLP [phone]  Julliana Whitmyer 03/20/2016, 3:51 PM

## 2016-03-21 ENCOUNTER — Inpatient Hospital Stay (HOSPITAL_COMMUNITY): Payer: Medicaid Other | Admitting: Physical Therapy

## 2016-03-21 ENCOUNTER — Inpatient Hospital Stay (HOSPITAL_COMMUNITY): Payer: Medicaid Other | Admitting: Speech Pathology

## 2016-03-21 ENCOUNTER — Inpatient Hospital Stay (HOSPITAL_COMMUNITY): Payer: Self-pay

## 2016-03-21 ENCOUNTER — Inpatient Hospital Stay (HOSPITAL_COMMUNITY): Payer: Medicaid Other | Admitting: Occupational Therapy

## 2016-03-21 NOTE — Progress Notes (Signed)
Physical Therapy Session Note  Patient Details  Name: Theresa SprayHeather Henderson MRN: [id number] Date of Birth: 16-Aug-1990  Today's Date: 03/21/2016 PT Individual Time: [id number] PT Individual Time Calculation (min): 31 min    Short Term Goals: Week 2:  PT Short Term Goal 1 (Week 2): = LTGs due to anticipated LOS  Skilled Therapeutic Interventions/Progress Updates:    Pt received in bed & agreeable to PT, denying c/o pain at beginning of session but later during session reported her head was hurting. Session focused on memory with therapist providing pt list of items to find on unit. Pt required heavy cuing & repetition to recall 4/4 items. Pt completed stair negotiation with B rails x 24 steps (3") with supervision. Therapist then asked pt to sort out animals into animals from [place] & animals you can find on a farm. Pt stated "I can't do that because I've never been there". Pt required maximum to follow commands and stay on task. Pt ambulated back to room experiencing 1 loss of balance with Min A to recover. Therapist educated pt to decrease gait speed but pt with poor carryover. At end of session pt sitting on EOB with bed alarm set & all needs within reach. Once therapist stepped out of room pt transferred out of bed and attempted to clean up her room & put clothes away. Attempted to redirect pt on need for safety and to return to sitting on bed with pt stating "I'm not sitting in here all day and getting fat". Therapist unable to redirect pt; pt left standing up in room with NT present.  Therapy Documentation Precautions:  Precautions Precautions: Fall Precaution Comments: impulsive Restrictions Weight Bearing Restrictions: No   See Function Navigator for Current Functional Status.   Therapy/Group: Individual Therapy  Sandi MariscalVictoria M Miller 03/21/2016, 5:29 PM

## 2016-03-21 NOTE — Progress Notes (Signed)
Physical Therapy Session Note  Patient Details  Name: Theresa Henderson MRN: [id number] Date of Birth: 30-Dec-1990  Today's Date: 03/21/2016 PT Individual Time: 0915-1015 and 1405-1500  PT Individual Time Calculation (min): 60 min and 55 min    Short Term Goals: Week 2:  PT Short Term Goal 1 (Week 2): = LTGs due to anticipated LOS  Skilled Therapeutic Interventions/Progress Updates:    Treatment 1: Patient sitting up in bed upon arrival. Patient requesting R foot to be dressed before sitting EOB, RN notified. Patient perseverating on not wearing underwear and dirty clothing and needing a shower before leaving room. Reviewed daily schedule with patient who was agreeable to waiting until OT session for bathing and dressing. Patient ambulated from room to and from therapy gym x 2 with supervision and no LOB with no cues for path finding. Patient instructed in FGA with score of 20/30, below cutoff of < or = to 22 for increased risk of falls, see details below. Patient unreliable for self-report of dizziness, but demonstrated increased signs/symptoms of dizziness with tandem gait, suspect due to decreased somatosensory input. Instructed in HEP x 1 viewing in static sitting with horizontal and vertical head turns for 30 sec trials with verbal cues for correct technique. Patient demonstrating overall decreased verbosity and improved attention to task and easily redirected this AM. Patient requesting to call RN for shower, reinforced schedule and OT session in 15 minutes, patient left sitting edge of bed with bed alarm on and call bell in reach.   Functional Gait Assessment (FGA) Requirements: A marked 6-m (20-ft) walkway that is marked with a 30.48-cm (12-in) width.  _3_ 1. GAIT LEVEL SURFACE Instructions: Walk at your normal speed from here to the next mark (6 m[20 ft]). Grading: Loraine LericheMark the highest category that applies. (3) Normal-Walks 6 m (20 ft) in less than 5.5 seconds, no assistive devices, good speed,  no evidence for imbalance, normal gait pattern, deviates no more than 15.24 cm (6 in) outside of the 30.48-cm (12-in) walkway width. (2) Mild impairment-Walks 6 m (20 ft) in less than 7 seconds but greater than 5.5 seconds, uses assistive device, slower speed, mild gait deviations, or deviates 15.24-25.4 cm (6-10 in) outside of the 30.48-cm (12-in) walkway width. (1) Moderate impairment-Walks 6 m (20 ft), slow speed, abnormal gait pattern, evidence for imbalance, or deviates 25.4-38.1 cm (10-15 in) outside of the 30.48-cm (12-in) walkway width. Requires more than 7 seconds to ambulate 6 m (20 ft). (0) Severe impairment-Cannot walk 6 m (20 ft) without assistance,severe gait deviations or imbalance, deviates greater than 38.1 cm (15 in) outside of the 30.48-cm (12-in) walkway width or reaches and touches the wall.  _3_ 2. CHANGE IN GAIT SPEED Instructions: Begin walking at your normal pace (for 1.5 m [5 ft]). When I tell you "go," walk as fast as you can (for 1.5 m [5 ft]). When I tell you "slow," walk as slowly as you can (for 1.5 m [5 ft]). Grading: Loraine LericheMark the highest category that applies. (3) Normal-Able to smoothly change walking speed without loss of balance or gait deviation. Shows a significant difference in walking speeds between normal, fast, and slow speeds. Deviates no more than 15.24 cm (6 in) outside of the 30.48-cm (12-in) walkway width. (2) Mild impairment-Is able to change speed but demonstrates mild gait deviations, deviates 15.24-25.4 cm (6-10 in) outside of the 30.48-cm (12-in) walkway width, or no gait deviations but unable to achieve a significant change in velocity, or uses an assistive device. (1)  Moderate impairment-Makes only minor adjustments to walking speed, or accomplishes a change in speed with significant gait deviations, deviates 25.4-38.1 cm (10-15 in) outside the 30.48-cm (12-in) walkway width, or changes speed but loses balance but is able to recover and continue  walking. (0) Severe impairment-Cannot change speeds, deviates greater than 38.1 cm (15 in) outside 30.48-cm (12-in) walkway width, or loses balance and has to reach for wall or be caught.  _2_ 3. GAIT WITH HORIZONTAL HEAD TURNS Instructions: Walk from here to the next mark 6 m (20 ft) away. Begin walking at your normal pace. Keep walking straight; after 3 steps, turn your head to the right and keep walking straight while looking to the right. After 3 more steps, turn your head to the left and keep walking straight while looking left. Continue alternating looking right and left every 3 steps until you have completed 2 repetitions in each direction. Grading: Loraine LericheMark the highest category that applies. (3) Normal-Performs head turns smoothly with no change in gait. Deviates no more than 15.24 cm (6 in) outside 30.48-cm (12-in) walkway width. (2) Mild impairment-Performs head turns smoothly with slight change in gait velocity (eg, minor disruption to smooth gait path), deviates 15.24-25.4 cm (6-10 in) outside 30.48-cm (12-in) walkway width, or uses an assistive device.  (1) Moderate impairment-Performs head turns with moderate change in gait velocity, slows down, deviates 25.4-38.1 cm (10-15 in) outside 30.48-cm (12-in) walkway width but recovers, can continue to walk. (0) Severe impairment-Performs task with severe disruption of gait (eg, staggers 38.1 cm [15 in] outside 30.48-cm (12-in) walkway width, loses balance, stops, or reaches for wall).  _2_ 4. GAIT WITH VERTICAL HEAD TURNS Instructions: Walk from here to the next mark (6 m [20 ft]). Begin walking at your normal pace. Keep walking straight; after 3 steps, tip your head up and keep walking straight while looking up. After 3 more steps, tip your head down, keep walking straight while looking down. Continue alternating looking up and down every 3 steps until you have completed 2 repetitions in each direction. Grading: Loraine LericheMark the highest category that  applies. (3) Normal-Performs head turns with no change in gait. Deviates no more than 15.24 cm (6 in) outside 30.48-cm (12-in) walkway width. (2) Mild impairment-Performs task with slight change in gait velocity (eg, minor disruption to smooth gait path), deviates 15.24-25.4 cm (6-10 in) outside 30.48-cm (12-in) walkway width or uses assistive device. (1) Moderate impairment-Performs task with moderate change in gait velocity, slows down, deviates 25.4-38.1 cm (10-15 in) outside 30.48-cm (12-in) walkway width but recovers, can continue to walk. (0) Severe impairment-Performs task with severe disruption of gait (eg, staggers 38.1 cm [15 in] outside 30.48-cm (12-in) walkway width, loses balance, stops, reaches for wall).  _2_ 5. GAIT AND PIVOT TURN Instructions: Begin with walking at your normal pace. When I tell you, "turn and stop," turn as quickly as you can to face the opposite direction and stop. Grading: Loraine LericheMark the highest category that applies. (3) Normal-Pivot turns safely within 3 seconds and stops quickly with no loss of balance. (2) Mild impairment-Pivot turns safely in _3 seconds and stops with no loss of balance, or pivot turns safely within 3 seconds and stops with mild imbalance, requires small steps to catch balance. (1) Moderate impairment-Turns slowly, requires verbal cueing, or requires several small steps to catch balance following turn and stop. (0) Severe impairment-Cannot turn safely, requires assistance to turn and stop.  _3_ 6. STEP OVER OBSTACLE Instructions: Begin walking at your normal speed.  When you come to the shoe box, step over it, not around it, and keep walking. Grading: Loraine Leriche the highest category that applies. (3) Normal-Is able to step over 2 stacked shoe boxes taped together (22.86 cm [9 in] total height) without changing gait speed; no evidence of imbalance. (2) Mild impairment-Is able to step over one shoe box (11.43 cm [4.5 in] total height) without changing  gait speed; no evidence of imbalance. (1) Moderate impairment-Is able to step over one shoe box (11.43 cm [4.5 in] total height) but must slow down and adjust steps to clear box safely. May require verbal cueing. (0) Severe impairment-Cannot perform without assistance.  _0_ 7. GAIT WITH NARROW BASE OF SUPPORT Instructions: Walk on the floor with arms folded across the chest, feet aligned heel to toe in tandem for a distance of 3.6 m [12 ft]. The number of steps taken in a straight line are counted for a maximum of 10 steps. Grading: Loraine Leriche the highest category that applies. (3) Normal-Is able to ambulate for 10 steps heel to toe with no staggering. (2) Mild impairment-Ambulates 7-9 steps. (1) Moderate impairment-Ambulates 4-7 steps. (0) Severe impairment-Ambulates less than 4 steps heel to toe or cannot perform without assistance.  _0_ 8. GAIT WITH EYES CLOSED Instructions: Walk at your normal speed from here to the next mark (6 m [20 ft]) with your eyes closed. Grading: Loraine Leriche the highest category that applies. (3) Normal-Walks 6 m (20 ft), no assistive devices, good speed, no evidence of imbalance, normal gait pattern, deviates no more than 15.24 cm (6 in) outside 30.48-cm (12-in) walkway width. Ambulates 6 m (20 ft) in less than 7 seconds. (2) Mild impairment-Walks 6 m (20 ft), uses assistive device, slower speed, mild gait deviations, deviates 15.24-25.4 cm (6-10 in) outside 30.48-cm (12-in) walkway width. Ambulates 6 m (20 ft) in less than 9 seconds but greater than 7 seconds. (1) Moderate impairment-Walks 6 m (20 ft), slow speed, abnormal gait pattern, evidence for imbalance, deviates 25.4-38.1 cm (10-15 in) outside 30.48-cm (12-in) walkway width. Requires more than 9 seconds to ambulate 6 m (20 ft). (0) Severe impairment-Cannot walk 6 m (20 ft) without assistance, severe gait deviations or imbalance, deviates greater than 38.1 cm (15 in) outside 30.48-cm (12-in) walkway width or will not  attempt task.  _2_ 9. AMBULATING BACKWARDS Instructions: Walk backwards until I tell you to stop. Grading: Loraine Leriche the highest category that applies. (3) Normal-Walks 6 m (20 ft), no assistive devices, good speed, no evidence for imbalance, normal gait pattern, deviates no more than 15.24 cm (6 in) outside 30.48-cm (12-in) walkway width. (2) Mild impairment-Walks 6 m (20 ft), uses assistive device, slower speed, mild gait deviations, deviates 15.24-25.4 cm (6-10 in) outside 30.48-cm (12-in) walkway width. (1) Moderate impairment-Walks 6 m (20 ft), slow speed, abnormal gait pattern, evidence for imbalance, deviates 25.4-38.1 cm (10-15 in) outside 30.48-cm (12-in) walkway width. (0) Severe impairment-Cannot walk 6 m (20 ft) without assistance, severe gait deviations or imbalance, deviates greater than 38.1 cm (15 in) outside 30.48-cm (12-in) walkway width or will not attempt task.  _3_ 10. STEPS Instructions: Walk up these stairs as you would at home (ie, using the rail if necessary). At the top turn around and walk down. Grading: Loraine Leriche the highest category that applies. (3) Normal-Alternating feet, no rail. (2) Mild impairment-Alternating feet, must use rail. (1) Moderate impairment-Two feet to a stair; must use rail. (0) Severe impairment-Cannot do safely.  TOTAL SCORE: ___20___ /30 (MAXIMUM SCORE=30)  Scores of ?  22/30 on the FGA were found to be effective in predicting falls, Sensitivity 85%, Specificity 86% Scores of ? 20/30 on the FGA were optimal to predict older adults residing in community dwellings who would sustain unexplained falls in the next 6 months, Sensitivity 100%, Specificity 76% Alvino Chapel & Lucianne Muss, 2010; aged 77 to 56, Older Adults) MDC: 4.2 points for CVA Juel Burrow et al, 2010) MCID: 8 points for Balance and Vestibular Disorders Levander Campion and Juel Burrow, 2014)  Treatment 2: Patient in room upon arrival, offered to assist with lunch as patient only ate dessert but patient refused multiple  times, stating she wanted to eat lunch meal before going to sleep after despite educating her she could order the same meal for dinner. Instructed in functional task to address attention, recall, and problem solving with patient given 3 items to locate in gift shop. Patient required total A for use of external aid for recall but refused to write a list. Patient required total A for intellectual awareness of cognitive deficits, continually referring to how she completed tasks PTA and unable to generalize task or comprehend purpose of task as she perseverated on owing money to gift shop for items purchased by mom. Patient ambulated to gift shop from rehab unit with supervision and 1 cue. Patient identified 2/3 items and required total A to identify last item. Patient reporting, "Just get whatever you want, they have lots of stuff here." Community ambulation throughout hospital from rehab unit > gift shop > up flight of stairs > main entrance [employer] > rehab unit without AD, unsteady gait at times but no LOB, requiring supervision. Patient's uncle present to observe vestibular HEP. In non-distracting environment, reviewed static sitting eye-head shifts requiring max-total verbal/tactile/demonstration cues. Patient with increased difficulty separating movements but stated "I've done this before that's why I'm so fast," with decreased frustration tolerance to cuing. Patient left sitting EOB with needs in reach and bed alarm on.   Therapy Documentation Precautions:  Precautions Precautions: Fall Precaution Comments: impulsive Restrictions Weight Bearing Restrictions: No Pain: Pain Assessment Pain Assessment: 0-10 Pain Score: 10-Worst pain ever Pain Type: Acute pain Pain Location: Head Pain Descriptors / Indicators: Headache Pain Onset: On-going Pain Intervention(s): RN made aware;Rest   See Function Navigator for Current Functional Status.   Therapy/Group: Individual Therapy  Kerney Elbe 03/21/2016, 10:37 AM
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
## 2016-03-21 NOTE — Progress Notes (Signed)
Occupational Therapy Session Note  Patient Details  Name: Theresa SprayHeather Henderson MRN: [id number] Date of Birth: 1991-04-24  Today's Date: 03/21/2016 OT Individual Time: [id number] OT Individual Time Calculation (min): 45 min     Short Term Goals:Week 2:  OT Short Term Goal 1 (Week 2): STGs = LTGs  Skilled Therapeutic Interventions/Progress Updates:    Pt seen for skilled OT to facilitate dynamic balance and cognitive skills with ADL retraining. Pt in bed stating that her bad headache had resolved and she was looking forward to shower. Pt had a good understanding this morning that she is working towards independence with self care and did not ask for as much A. Overall, pt demonstrated good balance with no LOB and she did not need any cuing for sequencing of tasks. Good attention to tasks.  Pt was much calmer today as she is looking forward to go home on Friday.  Pt in room resting on bed at end of session.  Therapy Documentation Precautions:  Precautions Precautions: Fall Precaution Comments: impulsive Restrictions Weight Bearing Restrictions: No  Pain: Pain Assessment Pain Assessment: No/denies pain  ADL:  See Function Navigator for Current Functional Status.   Therapy/Group: Individual Therapy  Bethaney Oshana 03/21/2016, 12:39 PM
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
## 2016-03-21 NOTE — Progress Notes (Signed)
Speech Language Pathology Daily Session Note  Patient Details  Name: Teodoro SprayHeather Patalano MRN: [id number] Date of Birth: [birth year]  Today's Date: 03/21/2016 SLP Individual Time: 0815-0915 SLP Individual Time Calculation (min): 60 min   Short Term Goals: Week 2: SLP Short Term Goal 1 (Week 2): Patient will consume current diet without overt s/s of aspiration with supervision verbal cues for use of small bites/sips and a slow rate of self-feeding.  SLP Short Term Goal 2 (Week 2): Patient will demonstrate efficient mastication and oral clearance of regular textures without overt s/s of aspiration with supervision verbal cues over 2 consecutive sessions prior to upgrade.  SLP Short Term Goal 3 (Week 2): Patient will decrease perseveration and will switch topic of conversation with no more than 3 attempts for redirection from clincian in 50% of opportunities.  SLP Short Term Goal 4 (Week 2): Patient will demonstrate sustained attention to a functional task for 2 minutes with Mod A verbal cues for redirection.  SLP Short Term Goal 5 (Week 2): Patient will demonstrate functional problem solving for basic and familair tasks with Mod A verbal cues for redirection.  SLP Short Term Goal 6 (Week 2): Patient will recall 1 event from a previous therapy session with Mod A question and verbal cues.   Skilled Therapeutic Interventions: Skilled treatment session focused on dysphagia and cognitive goals. SLP facilitated session by providing skilled observation with breakfast meal of regular textures with thin liquids via straw. Patient consumed meal without overt s/s of aspiration and was Mod I for use of swallowing compensatory strategies. Recommend patient upgrade to regular textures. Patient demonstrated alternating attention between self-feeding and functional conversation with extra time and Min-Mod A verbal cues. Patient continues to demonstrate verbosity and perseveration but is redirectable in 75% of opportunities.  Patient left supine in bed with alarm on and all needs within reach. Continue with current plan of care.   Function:  Eating Eating   Modified Consistency Diet: No Eating Assist Level: No help, No cues           Cognition Comprehension Comprehension assist level: Understands basic 90% of the time/cues < 10% of the time  Expression   Expression assist level: Expresses basic 90% of the time/requires cueing < 10% of the time.  Social Interaction Social Interaction assist level: Interacts appropriately 50 - 74% of the time - May be physically or verbally inappropriate.  Problem Solving Problem solving assist level: Solves basic 75 - 89% of the time/requires cueing 10 - 24% of the time  Memory Memory assist level: Recognizes or recalls 25 - 49% of the time/requires cueing 50 - 75% of the time    Pain Pain Assessment Pain Assessment: No/denies pain  Therapy/Group: Individual Therapy  Analina Filla 03/21/2016, 2:08 PM

## 2016-03-21 NOTE — Progress Notes (Signed)
Physical Therapy Discharge Summary  Patient Details  Name: Theresa Henderson MRN: [id number] Date of Birth: [birth year]  Today's Date: 03/21/2016   Patient has met 11 of 12 long term goals due to improved activity tolerance, improved balance, improved postural control, increased strength, decreased pain, ability to compensate for deficits, improved attention, improved awareness and improved coordination.  Patient to discharge at an ambulatory level Supervision. Patient's care partner is independent to provide the necessary physical and cognitive assistance at discharge.  Reasons goals not met: Pt continues to require total assist for day-to-day recall  Recommendation:  Patient will benefit from ongoing skilled PT services in home health setting to continue to advance safe functional mobility, address ongoing impairments in vestibular rehab, dynamic standing balance, safety, awareness, attention, problem solving, recall, activity tolerance, and minimize fall risk.  Equipment: No equipment provided  Reasons for discharge: treatment goals met and discharge from hospital  Patient/family agrees with progress made and goals achieved: Yes  PT Discharge Precautions/RestrictionsPrecautions Precautions: Fall Restrictions Weight Bearing Restrictions: No Pain Pain Assessment Pain Assessment: 0-10 Pain Score: 10-Worst pain ever Pain Type: Acute pain Pain Location: Knee Pain Orientation: Right Pain Descriptors / Indicators: Aching Pain Intervention(s): Rest;Emotional support Vision/Perception   No change from baseline Cognition Overall Cognitive Status: Impaired/Different from baseline Orientation Level: Oriented to person;Oriented to place;Oriented to situation;Oriented to time Attention: Sustained Focused Attention: Appears intact Sustained Attention: Impaired Sustained Attention Impairment: Functional complex;Verbal complex Memory: Impaired Memory Impairment: Decreased recall of new  information;Decreased short term memory;Prospective memory Decreased Short Term Memory: Verbal complex Awareness: Impaired Awareness Impairment: Anticipatory impairment;Emergent impairment Problem Solving: Impaired Problem Solving Impairment: Functional basic;Verbal basic Behaviors: Impulsive Rancho Los Amigos Scales of Cognitive Functioning: Confused/appropriate Sensation Sensation Light Touch: Appears Intact Stereognosis: Appears Intact Hot/Cold: Appears Intact Proprioception: Appears Intact Coordination Gross Motor Movements are Fluid and Coordinated: Yes Fine Motor Movements are Fluid and Coordinated: Yes Motor  Motor Motor: Within Functional Limits Motor - Discharge Observations: decreased standing balance due to vestibular impairments  Mobility Bed Mobility Bed Mobility: Supine to Sit;Rolling Right;Rolling Left;Sit to Supine Rolling Right: 5: Supervision Rolling Left: 5: Supervision Supine to Sit: 5: Supervision Sit to Supine: 5: Supervision Transfers Transfers: Yes Sit to Stand: 5: Supervision Stand to Sit: 5: Supervision Locomotion  Ambulation Ambulation: Yes Ambulation/Gait Assistance: 5: Supervision Ambulation Distance (Feet): 200 Feet Assistive device: None Gait Gait: Yes Gait Pattern: Impaired Gait Pattern: Decreased dorsiflexion - left;Decreased weight shift to right Gait velocity: 10 MWT = 0.9 m/s, WFL Stairs / Additional Locomotion Stairs: Yes Stairs Assistance: 5: Supervision Stair Management Technique: One rail Right Number of Stairs: 22 Height of Stairs: 6 Ramp: 5: Supervision Curb: 5: Supervision Wheelchair Mobility Wheelchair Mobility: No  Trunk/Postural Assessment  Cervical Assessment Cervical Assessment: Within Functional Limits Thoracic Assessment Thoracic Assessment: Within Functional Limits Lumbar Assessment Lumbar Assessment: Within Functional Limits Postural Control Protective Responses: improved from eval, adequate   Balance Balance Balance Assessed: Yes Static Standing Balance Static Standing - Balance Support: During functional activity;No upper extremity supported Static Standing - Level of Assistance: 5: Stand by assistance Dynamic Standing Balance Dynamic Standing - Balance Support: No upper extremity supported;During functional activity Dynamic Standing - Level of Assistance: 5: Stand by assistance   Functional Gait Assessment (FGA) Requirements: A marked 6-m (20-ft) walkway that is marked with a 30.48-cm (12-in) width.  _3_                1. GAIT LEVEL SURFACE Instructions: Walk at your normal speed from here to  the next mark (6 m[20 ft]). Grading: [place] the highest category that applies. (3) Normal-Walks 6 m (20 ft) in less than 5.5 seconds, no assistive devices, good speed, no evidence for imbalance, normal gait pattern, deviates no more than 15.24 cm (6 in) outside of the 30.48-cm (12-in) walkway width. (2) Mild impairment-Walks 6 m (20 ft) in less than 7 seconds but greater than 5.5 seconds, uses assistive device, slower speed, mild gait deviations, or deviates 15.24-25.4 cm (6-10 in) outside of the 30.48-cm (12-in) walkway width. (1) Moderate impairment-Walks 6 m (20 ft), slow speed, abnormal gait pattern, evidence for imbalance, or deviates 25.4-38.1 cm (10-15 in) outside of the 30.48-cm (12-in) walkway width. Requires more than 7 seconds to ambulate 6 m (20 ft). (0) Severe impairment-Cannot walk 6 m (20 ft) without assistance,severe gait deviations or imbalance, deviates greater than 38.1 cm (15 in) outside of the 30.48-cm (12-in) walkway width or reaches and touches the wall.  _3_                2. CHANGE IN GAIT SPEED Instructions: Begin walking at your normal pace (for 1.5 m [5 ft]). When I tell you "go," walk as fast as you can (for 1.5 m [5 ft]). When I tell you "slow," walk as slowly as you can (for 1.5 m [5 ft]). Grading: [place] the highest category that applies. (3) Normal-Able to  smoothly change walking speed without loss of balance or gait deviation. Shows a significant difference in walking speeds between normal, fast, and slow speeds. Deviates no more than 15.24 cm (6 in) outside of the 30.48-cm (12-in) walkway width. (2) Mild impairment-Is able to change speed but demonstrates mild gait deviations, deviates 15.24-25.4 cm (6-10 in) outside of the 30.48-cm (12-in) walkway width, or no gait deviations but unable to achieve a significant change in velocity, or uses an assistive device. (1) Moderate impairment-Makes only minor adjustments to walking speed, or accomplishes a change in speed with significant gait deviations, deviates 25.4-38.1 cm (10-15 in) outside the 30.48-cm (12-in) walkway width, or changes speed but loses balance but is able to recover and continue walking. (0) Severe impairment-Cannot change speeds, deviates greater than 38.1 cm (15 in) outside 30.48-cm (12-in) walkway width, or loses balance and has to reach for wall or be caught.  _2_                3. GAIT WITH HORIZONTAL HEAD TURNS Instructions: Walk from here to the next mark 6 m (20 ft) away. Begin walking at your normal pace. Keep walking straight; after 3 steps, turn your head to the right and keep walking straight while looking to the right. After 3 more steps, turn your head to the left and keep walking straight while looking left. Continue alternating looking right and left every 3 steps until you have completed 2 repetitions in each direction. Grading: [place] the highest category that applies. (3) Normal-Performs head turns smoothly with no change in gait. Deviates no more than 15.24 cm (6 in) outside 30.48-cm (12-in) walkway width. (2) Mild impairment-Performs head turns smoothly with slight change in gait velocity (eg, minor disruption to smooth gait path), deviates 15.24-25.4 cm (6-10 in) outside 30.48-cm (12-in) walkway width, or uses an assistive device.  (1) Moderate impairment-Performs head  turns with moderate change in gait velocity, slows down, deviates 25.4-38.1 cm (10-15 in) outside 30.48-cm (12-in) walkway width but recovers, can continue to walk. (0) Severe impairment-Performs task with severe disruption of gait (eg, staggers 38.1 cm [15  in] outside 30.48-cm (12-in) walkway width, loses balance, stops, or reaches for wall).  _2_                4. GAIT WITH VERTICAL HEAD TURNS Instructions: Walk from here to the next mark (6 m [20 ft]). Begin walking at your normal pace. Keep walking straight; after 3 steps, tip your head up and keep walking straight while looking up. After 3 more steps, tip your head down, keep walking straight while looking down. Continue alternating looking up and down every 3 steps until you have completed 2 repetitions in each direction. Grading: [place] the highest category that applies. (3) Normal-Performs head turns with no change in gait. Deviates no more than 15.24 cm (6 in) outside 30.48-cm (12-in) walkway width. (2) Mild impairment-Performs task with slight change in gait velocity (eg, minor disruption to smooth gait path), deviates 15.24-25.4 cm (6-10 in) outside 30.48-cm (12-in) walkway width or uses assistive device. (1) Moderate impairment-Performs task with moderate change in gait velocity, slows down, deviates 25.4-38.1 cm (10-15 in) outside 30.48-cm (12-in) walkway width but recovers, can continue to walk. (0) Severe impairment-Performs task with severe disruption of gait (eg, staggers 38.1 cm [15 in] outside 30.48-cm (12-in) walkway width, loses balance, stops, reaches for wall).  _2_                5. GAIT AND PIVOT TURN Instructions: Begin with walking at your normal pace. When I tell you, "turn and stop," turn as quickly as you can to face the opposite direction and stop. Grading: [place] the highest category that applies. (3) Normal-Pivot turns safely within 3 seconds and stops quickly with no loss of balance. (2) Mild impairment-Pivot turns  safely in _3 seconds and stops with no loss of balance, or pivot turns safely within 3 seconds and stops with mild imbalance, requires small steps to catch balance. (1) Moderate impairment-Turns slowly, requires verbal cueing, or requires several small steps to catch balance following turn and stop. (0) Severe impairment-Cannot turn safely, requires assistance to turn and stop.  _3_                6. STEP OVER OBSTACLE Instructions: Begin walking at your normal speed. When you come to the shoe box, step over it, not around it, and keep walking. Grading: [place] the highest category that applies. (3) Normal-Is able to step over 2 stacked shoe boxes taped together (22.86 cm [9 in] total height) without changing gait speed; no evidence of imbalance. (2) Mild impairment-Is able to step over one shoe box (11.43 cm [4.5 in] total height) without changing gait speed; no evidence of imbalance. (1) Moderate impairment-Is able to step over one shoe box (11.43 cm [4.5 in] total height) but must slow down and adjust steps to clear box safely. May require verbal cueing. (0) Severe impairment-Cannot perform without assistance.  _0_                7. GAIT WITH NARROW BASE OF SUPPORT Instructions: Walk on the floor with arms folded across the chest, feet aligned heel to toe in tandem for a distance of 3.6 m [12 ft]. The number of steps taken in a straight line are counted for a maximum of 10 steps. Grading: [place] the highest category that applies. (3) Normal-Is able to ambulate for 10 steps heel to toe with no staggering. (2) Mild impairment-Ambulates 7-9 steps. (1) Moderate impairment-Ambulates 4-7 steps. (0) Severe impairment-Ambulates less than 4 steps heel to toe or  cannot perform without assistance.  _0_                8. GAIT WITH EYES CLOSED Instructions: Walk at your normal speed from here to the next mark (6 m [20 ft]) with your eyes closed. Grading: [place] the highest category that applies. (3)  Normal-Walks 6 m (20 ft), no assistive devices, good speed, no evidence of imbalance, normal gait pattern, deviates no more than 15.24 cm (6 in) outside 30.48-cm (12-in) walkway width. Ambulates 6 m (20 ft) in less than 7 seconds. (2) Mild impairment-Walks 6 m (20 ft), uses assistive device, slower speed, mild gait deviations, deviates 15.24-25.4 cm (6-10 in) outside 30.48-cm (12-in) walkway width. Ambulates 6 m (20 ft) in less than 9 seconds but greater than 7 seconds. (1) Moderate impairment-Walks 6 m (20 ft), slow speed, abnormal gait pattern, evidence for imbalance, deviates 25.4-38.1 cm (10-15 in) outside 30.48-cm (12-in) walkway width. Requires more than 9 seconds to ambulate 6 m (20 ft). (0) Severe impairment-Cannot walk 6 m (20 ft) without assistance, severe gait deviations or imbalance, deviates greater than 38.1 cm (15 in) outside 30.48-cm (12-in) walkway width or will not attempt task.  _2_                9. AMBULATING BACKWARDS Instructions: Walk backwards until I tell you to stop. Grading: [place] the highest category that applies. (3) Normal-Walks 6 m (20 ft), no assistive devices, good speed, no evidence for imbalance, normal gait pattern, deviates no more than 15.24 cm (6 in) outside 30.48-cm (12-in) walkway width. (2) Mild impairment-Walks 6 m (20 ft), uses assistive device, slower speed, mild gait deviations, deviates 15.24-25.4 cm (6-10 in) outside 30.48-cm (12-in) walkway width. (1) Moderate impairment-Walks 6 m (20 ft), slow speed, abnormal gait pattern, evidence for imbalance, deviates 25.4-38.1 cm (10-15 in) outside 30.48-cm (12-in) walkway width. (0) Severe impairment-Cannot walk 6 m (20 ft) without assistance, severe gait deviations or imbalance, deviates greater than 38.1 cm (15 in) outside 30.48-cm (12-in) walkway width or will not attempt task.  _3_                10. STEPS Instructions: Walk up these stairs as you would at home (ie, using the rail if necessary). At the top  turn around and walk down. Grading: Elta [place] the highest category that applies. (3) Normal-Alternating feet, no rail. (2) Mild impairment-Alternating feet, must use rail. (1) Moderate impairment-Two feet to a stair; must use rail. (0) Severe impairment-Cannot do safely.  TOTAL SCORE: ___20___ /30 (MAXIMUM SCORE=30)  Scores of ? 22/30 on the FGA were found to be effective in predicting falls, Sensitivity 85%, Specificity 86% Scores of ? 20/30 on the FGA were optimal to predict older adults residing in community dwellings who would sustain unexplained falls in the next 6 months, Sensitivity 100%, Specificity 76% ([place], 2010; aged 81 to 27, Older Adults) [place]: 4.2 points for CVA Augustin Coupe et al, 2010) MCID: 8 points for Balance and Vestibular Disorders Marjorie Smolder and Augustin Coupe, 2014) Extremity Assessment  RUE Assessment RUE Assessment: Within Functional Limits LUE Assessment LUE Assessment: Within Functional Limits RLE Assessment RLE Assessment: Within Functional Limits LLE Assessment LLE Assessment: Within Functional Limits   See Function Navigator for Current Functional Status.  Carney Living A 03/22/2016, 3:00 PM

## 2016-03-21 NOTE — Progress Notes (Signed)
Occupational Therapy Note  Patient Details  Name: Theresa SprayHeather Henderson MRN: [id number] Date of Birth: 1990-10-28  Today's Date: 03/21/2016 OT Individual Time: 1300-1330 OT Individual Time Calculation (min): 30 min    Pt denied pain Individual therapy  Pt resting in bed upon arrival and agreeable to therapy.  Pt stated that she needed new socks and called RN.  Pt insistent that RN/NT had to bring the socks to her and declined this therapist's assistance.  Pt donned socks and shoes while sitting EOB.  Pt amb to ADL apartment and engaged in home mgmt tasks with focus on dynamic standing balance and sequencing while engaged in ongoing conversation.  Pt stated she had never been to apartment but had been to kitchen to make pizza.  Pt returned to room and was able to locate room without verbal cues. Focus on functional amb, dynamic standing balance, attention to task, path finding, and safety awareness to increase independence with BADLs.    Lavone NeriLanier, Sindi Beckworth [facility] 03/21/2016, 1:38 PM

## 2016-03-22 ENCOUNTER — Inpatient Hospital Stay (HOSPITAL_COMMUNITY): Payer: Medicaid Other | Admitting: Speech Pathology

## 2016-03-22 ENCOUNTER — Inpatient Hospital Stay (HOSPITAL_COMMUNITY): Payer: Self-pay

## 2016-03-22 ENCOUNTER — Inpatient Hospital Stay (HOSPITAL_COMMUNITY): Payer: Medicaid Other | Admitting: Occupational Therapy

## 2016-03-22 ENCOUNTER — Inpatient Hospital Stay (HOSPITAL_COMMUNITY): Payer: Medicaid Other | Admitting: Physical Therapy

## 2016-03-22 MED ORDER — DIVALPROEX SODIUM 500 MG PO DR TAB: 500.0000 mg | DELAYED_RELEASE_TABLET | Freq: Two times a day (BID) | ORAL | 1 refills | Status: DC

## 2016-03-22 MED ORDER — QUETIAPINE FUMARATE 25 MG PO TABS: ORAL_TABLET | ORAL | 0 refills | Status: DC

## 2016-03-22 MED ORDER — OXYCODONE HCL 5 MG PO TABS: 5.0000 mg | ORAL_TABLET | ORAL | 0 refills | Status: DC | PRN

## 2016-03-22 MED ORDER — ADULT MULTIVITAMIN W/MINERALS CH: 1.0000 | ORAL_TABLET | Freq: Every day | ORAL | Status: AC

## 2016-03-22 MED ORDER — METOPROLOL TARTRATE 25 MG PO TABS: 12.5000 mg | ORAL_TABLET | Freq: Two times a day (BID) | ORAL | 0 refills | Status: DC

## 2016-03-22 MED ORDER — FLUOXETINE HCL 40 MG PO CAPS: 40.0000 mg | ORAL_CAPSULE | Freq: Every day | ORAL | 3 refills | Status: DC

## 2016-03-22 MED ORDER — CLONAZEPAM 0.5 MG PO TABS
0.5000 mg | ORAL_TABLET | Freq: Three times a day (TID) | ORAL | Status: DC
  Administered 2016-03-22 – 2016-03-23 (×3): 0.5 mg via ORAL
  Filled 2016-03-22 (×4): qty 1

## 2016-03-22 MED ORDER — ASPIRIN 325 MG PO TABS: 325.0000 mg | ORAL_TABLET | Freq: Every day | ORAL | Status: AC

## 2016-03-22 MED ORDER — CLONAZEPAM 0.5 MG PO TABS: 0.5000 mg | ORAL_TABLET | Freq: Three times a day (TID) | ORAL | 0 refills | Status: DC

## 2016-03-22 NOTE — Progress Notes (Signed)
Physical Therapy Session Note  Patient Details  Name: Theresa SprayHeather Stgermain MRN: [id number] Date of Birth: 02-06-1991  Today's Date: 03/22/2016 PT Individual Time: 1430-1530 PT Individual Time Calculation (min): 60 min    Short Term Goals: Week 2:  PT Short Term Goal 1 (Week 2): = LTGs due to anticipated LOS  Skilled Therapeutic Interventions/Progress Updates:    Patient seen for skilled therapy session with mother with focus on family training, recall, and attention with functional mobility tasks and vestibular rehab. Patient perseverative on leaving at 10am tomorrow despite family reporting being unable to pick her up until 5 pm "because the doctor said I could." Patient reported she plans to walk home at 10 am no matter what, treatment team aware and patient redirected to current therapy in order to be able to leave tomorrow. Patient recently finished baking cookies in ADL apartment, returned to kitchen and patient instructed in rinsing dishes and loading dishwasher with mod-max cues for completeness of task. Patient required total cues to implement and refer to external aid to complete "graduation day" tasks and floor transfer with supervision overall without device and verbal cues to decrease pace for controlled movement and safety. Patient's mother with distracting unrelated comments throughout session and at one point, patient told her, "We can talk about that later, I need to do my therapy." Patient instructed in x 1 viewing in sitting > standing > standing with R/L LE on BOSU ball > standing on foam pad with horizontal and vertical head turns for gaze stabilization. Patient with no c/o increased dizziness throughout session, but intermittently demonstrated unsteady or staggering gait but recovered independently with supervision. Education provided to mother on decreasing distractions, redirecting patient, talking to patient like an adult instead of "babying" her, and accepting patient's current status  in her recovery instead of comparing to "the old [name]." Patient's mother verbalized understanding. Patient left in room with mother present.   Therapy Documentationt Precautions:  Precautions Precautions: Fall Precaution Comments: impulsive Restrictions Weight Bearing Restrictions: No Pain: Pain Assessment Pain Assessment: 0-10 Pain Score: 10-Worst pain ever Pain Type: Acute pain Pain Location: Knee Pain Orientation: Right Pain Descriptors / Indicators: Aching Pain Intervention(s): Rest;Emotional support Trunk/Postural Assessment : Cervical Assessment Cervical Assessment: Within Functional Limits Thoracic Assessment Thoracic Assessment: Within Functional Limits Lumbar Assessment Lumbar Assessment: Within Functional Limits Postural Control Protective Responses: improved from eval, adequate  Balance: Static Sitting Balance Static Sitting - Level of Assistance: 7: Independent Dynamic Sitting Balance Dynamic Sitting - Level of Assistance: 7: Independent Static Standing Balance Static Standing - Level of Assistance: 5: Stand by assistance Dynamic Standing Balance Dynamic Standing - Level of Assistance: 5: Stand by assistance   See Function Navigator for Current Functional Status.   Therapy/Group: Individual Therapy  Kerney ElbeVarner, Eitan Doubleday A 03/22/2016, 4:26 PM

## 2016-03-22 NOTE — Progress Notes (Signed)
Occupational Therapy Session Note  Patient Details  Name: Theresa Henderson MRN: [id number] Date of Birth: 1990/11/30  Today's Date: 03/22/2016 OT Individual Time: [id number] OT Individual Time Calculation (min): 75 min     Short Term Goals:Week 2:  OT Short Term Goal 1 (Week 2): STGs = LTGs  Skilled Therapeutic Interventions/Progress Updates:    Pt seen for skilled OT to facilitate dynamic balance, safety awareness with mobility, safety with vestibular deficits, cognitive skills with ADL retraining and gift shop activity. Pt completed all self care with S with frequent cues for slow transitions between movements to avoid dizziness. Pt continuing to repeat the same story numerous times even with cues that she has already told this therapist that story. Pt is able to recall some day to day activities in great detail and other activities she is not able to recall well. Pt very concerned about her future with work/ relationships.  Discussed focusing on short term weekly progress.  Pt eager to show this clinician what she wants to purchase for her family in the gift shop. Pt ambulated to gift shop with 1 LOB to and 1 from the shop as she would turn her head while walking. Max cues to keep visual focus forward.  Pt asked sales attendant to total up cost of items and she wrote down the cost for the pt.  Pt in room at end of session.    Therapy Documentation Precautions:  Precautions Precautions: Fall Precaution Comments: impulsive Restrictions Weight Bearing Restrictions: No    Vital Signs: Therapy Vitals Pulse Rate: 85 BP: (!) 105/53 Patient Position (if appropriate): Sitting Pain: Pain Assessment Pain Assessment: 0-10 Pain Score: 7  Pain Type: Acute pain Pain Location: Head Pain Orientation: Right Pain Descriptors / Indicators: Aching Pain Frequency: Intermittent Pain Onset: On-going Patients Stated Pain Goal: 2 Pain Intervention(s): Medication (See eMAR) Multiple Pain Sites:  No ADL:  See Function Navigator for Current Functional Status.   Therapy/Group: Individual Therapy  SAGUIER,JULIA 03/22/2016, 9:14 AM

## 2016-03-22 NOTE — Progress Notes (Signed)
Social Work Patient ID: Theresa Henderson, female   DOB: [birth year], 25 y.o.   MRN: [id number]   CSW met with pt and family 03-21-16 to update them on team conference discussion.  Pt is pleased to be going home and family came for family conference with the therapists and this CSW.  CSW has made referrals for pt and family and gave them resources for support and information.  Full note to follow detailing team conference meeting.  It was this CSW's understanding that pt's mother would come for family education today, but she has not yet arrived.

## 2016-03-22 NOTE — Progress Notes (Signed)
Subjective/Complaints: Excited about going home. Hearing unchanged. Reported blood from right ear, but both nurses this morning state that no bleeding occurred.     Review systems: Denies shortness of breath, nausea, vomiting, diarrhea or constipation. Limited by cognition, poor attention and perseveration, no suicidal plan  Objective: Vital Signs: Blood pressure (!) 105/53, pulse 85, temperature 98.2 F (36.8 C), temperature source Oral, resp. rate 18, height _0  (1.88 m), weight 92.4 kg (203 lb 9.6 oz), last menstrual period 02/29/2016, SpO2 98 %. No results found. Results for orders placed or performed during the hospital encounter of 03/08/16 (from the past 72 hour(s))  Basic metabolic panel     Status: None   Collection Time: 03/19/16 10:28 AM  Result Value Ref Range   Sodium 141 135 - 145 mmol/L   Potassium 3.9 3.5 - 5.1 mmol/L   Chloride 109 101 - 111 mmol/L   CO2 24 22 - 32 mmol/L   Glucose, Bld 87 65 - 99 mg/dL   BUN 10 6 - 20 mg/dL   Creatinine, Ser 0.76 0.44 - 1.00 mg/dL   Calcium 9.0 8.9 - 10.3 mg/dL   GFR calc non Af Amer >60 >60 mL/min   GFR calc Af Amer >60 >60 mL/min    Comment: (NOTE) The eGFR has been calculated using the CKD EPI equation. This calculation has not been validated in all clinical situations. eGFR's persistently <60 mL/min signify possible Chronic Kidney Disease.    Anion gap 8 5 - 15  CBC     Status: Abnormal   Collection Time: 03/19/16 10:28 AM  Result Value Ref Range   WBC 4.3 4.0 - 10.5 K/uL   RBC 4.22 3.87 - 5.11 MIL/uL   Hemoglobin 11.6 (L) 12.0 - 15.0 g/dL   HCT 36.3 36.0 - 46.0 %   MCV 86.0 78.0 - 100.0 fL   MCH 27.5 26.0 - 34.0 pg   MCHC 32.0 30.0 - 36.0 g/dL   RDW 13.5 11.5 - 15.5 %   Platelets 301 150 - 400 K/uL  Valproic acid level     Status: None   Collection Time: 03/19/16 10:28 AM  Result Value Ref Range   Valproic Acid Lvl 73 50.0 - 100.0 ug/mL  Hepatic function panel     Status: Abnormal   Collection Time:  03/19/16 12:31 PM  Result Value Ref Range   Total Protein 6.1 (L) 6.5 - 8.1 g/dL   Albumin 3.6 3.5 - 5.0 g/dL   AST 23 15 - 41 U/L   ALT 14 14 - 54 U/L   Alkaline Phosphatase 71 38 - 126 U/L   Total Bilirubin 0.4 0.3 - 1.2 mg/dL   Bilirubin, Direct <0.1 (L) 0.1 - 0.5 mg/dL   Indirect Bilirubin NOT CALCULATED 0.3 - 0.9 mg/dL      General: No acute distress ENT:  Right ear appears normal external canal looks clean , no dried blood or other fluid noted, no stain on pillowcase Mood and affect are appropriate Heart: Regular rate and rhythm no rubs murmurs or extra sounds Lungs: Clear to auscultation, breathing unlabored, no rales or wheezes Abdomen: Positive bowel sounds, soft nontender to palpation, nondistended Extremities: No clubbing, cyanosis, or edema Skin: small scrape on dorsum of right foot/ mild associated swelling Neurologic: Cranial nerves II through XII intact except for hearing right ear (dried blood however in ext canal).  motor strength is 5/5 in bilateral deltoid, bicep, tricep, grip, hip flexor, knee extensors, ankle dorsiflexor and plantar flexor Sensory  exam normal sensation to light touch and proprioception in bilateral upper and lower extremities Cerebellar exam normal finger to nose to finger as well as heel to shin in bilateral upper and lower extremities Pt impulsive, easily distracted/attention waxes and wanes. Tearful/emotional. Non agitated. Needs frequent redirection. Perseverative--SOME IMPROVEMENT Musculoskeletal: Full range of motion in all 4 extremities. No joint swelling   Assessment/Plan: 1. 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 Physiatrist is providing close team supervision and 24 hour management of active medical problems listed below. Physiatrist and rehab team continue to assess barriers  to discharge/monitor patient progress toward functional and medical goals. FIM: Function - Bathing Position: Shower Body parts bathed by patient: Right arm, Left arm, Chest, Abdomen, Front perineal area, Buttocks, Right upper leg, Right lower leg, Left upper leg, Left lower leg Body parts bathed by helper: Back Bathing not applicable: Back Assist Level: Supervision or verbal cues  Function- Upper Body Dressing/Undressing What is the patient wearing?: Bra, Pull over shirt/dress Bra - Perfomed by patient: Thread/unthread right bra strap, Thread/unthread left bra strap, Hook/unhook bra (pull down sports bra) Bra - Perfomed by helper: Hook/unhook bra (pull down sports bra) Pull over shirt/dress - Perfomed by patient: Thread/unthread right sleeve, Put head through opening, Thread/unthread left sleeve, Pull shirt over trunk Assist Level: Supervision or verbal cues Function - Lower Body Dressing/Undressing What is the patient wearing?: Underwear, Pants, Socks, Shoes Position: Sitting EOB Underwear - Performed by patient: Thread/unthread right underwear leg, Thread/unthread left underwear leg, Pull underwear up/down Pants- Performed by patient: Thread/unthread right pants leg, Thread/unthread left pants leg, Pull pants up/down Pants- Performed by helper: Thread/unthread right pants leg, Thread/unthread left pants leg (pt with severe headache and requested A) Non-skid slipper socks- Performed by patient: Don/doff right sock, Don/doff left sock Socks - Performed by patient: Don/doff right sock, Don/doff left sock Socks - Performed by helper: Don/doff right sock Shoes - Performed by patient: Don/doff right shoe, Don/doff left shoe Shoes - Performed by helper: Don/doff right shoe Assist for footwear: Supervision/touching assist Assist for lower body dressing: Supervision or verbal cues Set up : To obtain clothing/put away  Function - Toileting Toileting steps completed by patient: Adjust clothing  prior to toileting, Performs perineal hygiene, Adjust clothing after toileting Toileting steps completed by helper: Performs perineal hygiene Toileting Assistive Devices: Grab bar or rail Assist level: Supervision or verbal cues  Function - Air cabin crew transfer assistive device: Grab bar Assist level to toilet: Supervision or verbal cues Assist level from toilet: Supervision or verbal cues Assist level to bedside commode (at bedside): Supervision or verbal cues Assist level from bedside commode (at bedside): Supervision or verbal cues  Function - Chair/bed transfer Chair/bed transfer method: Ambulatory Chair/bed transfer assist level: Supervision or verbal cues Chair/bed transfer assistive device: Armrests Chair/bed transfer details: Verbal cues for precautions/safety  Function - Locomotion: Wheelchair Will patient use wheelchair at discharge?: No Function - Locomotion: Ambulation Assistive device: No device Max distance: >200 ft Assist level: Supervision or verbal cues Assist level: Supervision or verbal cues Assist level: Supervision or verbal cues Assist level: Supervision or verbal cues Assist level: Touching or steadying assistance (Pt > 75%)  Function - Comprehension Comprehension: Auditory Comprehension assist level: Understands basic 90% of the time/cues < 10% of the time  Function - Expression Expression: Verbal Expression assist level: Expresses basic 90% of the time/requires cueing <  10% of the time.  Function - Social Interaction Social Interaction assist level: Interacts appropriately 50 - 74% of the time - May be physically or verbally inappropriate.  Function - Problem Solving Problem solving assist level: Solves basic 75 - 89% of the time/requires cueing 10 - 24% of the time  Function - Memory Memory assist level: Recognizes or recalls 25 - 49% of the time/requires cueing 50 - 75% of the time Patient normally able to recall (first 3 days only):  Current season, That he or she is in a hospital, Staff names and faces   Medical Problem List and Plan: 1. 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   - cont CIR PT, OT, SLP, Neuropsych   -displaying improved awareness. Memory inconsistent 2. DVT Prophylaxis/Anticoagulation: Mechanical: Sequential compression devices, below knee Bilateral lower extremities 3. Pain Management: oxycodone 23m Q4H prn 4. Mood: emotional lability--improved appreciate psych consult  -continue psychosocial support, family ed  -TBI related agitation, continue day time seroquel 232mbid in addition to hs seroquel which I increased to 5031m-TBI related agitation titrated depakote to 500m14md-- level pending today  -ritalin stopped which appears to have been trigger for outbursts last week  -increased prozac to 40mg27mly  -meds overall effective.   -will try reduced dose klonopin today 0.5mg t59m5. Neuropsych: This patient is not capable of making decisions on her own behalf.  - attention deficits--although her attention is a major factor, cannot risk exacerbating her emotional state---ritalin stopped 6. Skin/Wound Care: local care/bandage to foot---reassured pt 7. Fluids/Electrolytes/Nutrition: encourage po 8.  ?Seizure taken off Keppra by NS, will monitor 9.  Pulmonary contusions as well as aspiration pneumonia. 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 Tachycardia with episode of A flutter was on beta blocker as recommended by cardiology Dr NelsonMeda Coffee Insomnia. seroquel as above  -improved sleep "hygiene" in general 13. 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

## 2016-03-22 NOTE — Progress Notes (Signed)
Speech Language Pathology Daily Session Note  Patient Details  Name: Teodoro SprayHeather Morash MRN: [id number] Date of Birth: 04-25-1991  Today's Date: 03/22/2016 SLP Individual Time: 1100-1200 SLP Individual Time Calculation (min): 60 min   Short Term Goals: Week 2: SLP Short Term Goal 1 (Week 2): Patient will consume current diet without overt s/s of aspiration with supervision verbal cues for use of small bites/sips and a slow rate of self-feeding.  SLP Short Term Goal 2 (Week 2): Patient will demonstrate efficient mastication and oral clearance of regular textures without overt s/s of aspiration with supervision verbal cues over 2 consecutive sessions prior to upgrade.  SLP Short Term Goal 3 (Week 2): Patient will decrease perseveration and will switch topic of conversation with no more than 3 attempts for redirection from clincian in 50% of opportunities.  SLP Short Term Goal 4 (Week 2): Patient will demonstrate sustained attention to a functional task for 2 minutes with Mod A verbal cues for redirection.  SLP Short Term Goal 5 (Week 2): Patient will demonstrate functional problem solving for basic and familair tasks with Mod A verbal cues for redirection.  SLP Short Term Goal 6 (Week 2): Patient will recall 1 event from a previous therapy session with Mod A question and verbal cues.   Skilled Therapeutic Interventions: Skilled treatment session focused on cognitive goals. SLP facilitated session by administering the MoCA (version 8.1). Patient scored 17/30 points with a score of 26 or above considered normal. Patient demonstrated deficits in th areas of executive functioning, immediate and short-term recall, problem solving and attention. Patient also required Min-Mod A question and verbal cues to identify activities she can safely perform at home and activities she should not initially do while at home along with verbal reasoning. Clinician typed/printed list so patient can "hang it on her fridge" per  patient request. Patient left in dayroom with family consuming her lunch. NT aware. Continue with current plan of care.   Function:  Cognition Comprehension Comprehension assist level: Understands basic 90% of the time/cues < 10% of the time  Expression   Expression assist level: Expresses basic 90% of the time/requires cueing < 10% of the time.  Social Interaction Social Interaction assist level: Interacts appropriately 50 - 74% of the time - May be physically or verbally inappropriate.  Problem Solving Problem solving assist level: Solves basic 75 - 89% of the time/requires cueing 10 - 24% of the time  Memory Memory assist level: Recognizes or recalls 25 - 49% of the time/requires cueing 50 - 75% of the time    Pain No/Denies Pain   Therapy/Group: Individual Therapy  Ellyson Rarick 03/22/2016, 3:05 PM

## 2016-03-22 NOTE — Progress Notes (Signed)
Occupational Therapy Discharge Summary  Patient Details  Name: Theresa Henderson MRN: [id number] Date of Birth: 25-Jul-1991   Patient has met 10 of 10 long term goals due to improved activity tolerance, improved balance, ability to compensate for deficits, improved attention and improved awareness.  Patient to discharge at overall Supervision level.  Patient's care partner is independent to provide the necessary physical and cognitive assistance at discharge.  Pt continues to function at a Ranch[place] level and needs close S with all tasks.  Reasons goals not met: n/a  Recommendation:  Patient will benefit from ongoing skilled OT services in home health setting to continue to advance functional skills in the area of BADL and iADL.  Equipment: shower chair  Reasons for discharge: treatment goals met  Patient/family agrees with progress made and goals achieved: Yes  OT Discharge Precautions/Restrictions  Restrictions Weight Bearing Restrictions: No  ADL Close S due to decreased balance  Vision/Perception    vestibular deficits with eye gaze/focus; impaired near vision Cognition Overall Cognitive Status: Impaired/Different from baseline Orientation Level: Oriented to person;Oriented to place;Oriented to situation;Oriented to time Attention: Sustained Focused Attention: Appears intact Sustained Attention: Impaired Sustained Attention Impairment: Functional complex;Verbal complex Memory: Impaired Memory Impairment: Decreased recall of new information;Decreased short term memory;Prospective memory Decreased Short Term Memory: Verbal complex Awareness: Impaired Awareness Impairment: Anticipatory impairment;Emergent impairment Problem Solving: Impaired Problem Solving Impairment: Functional basic;Verbal basic Behaviors: Impulsive Rancho Los Amigos Scales of Cognitive Functioning: Confused/appropriate Sensation Sensation Light Touch: Appears Intact Stereognosis: Appears  Intact Hot/Cold: Appears Intact Proprioception: Appears Intact Coordination Gross Motor Movements are Fluid and Coordinated: Yes Fine Motor Movements are Fluid and Coordinated: Yes Motor  Motor Motor: Within Functional Limits Motor - Discharge Observations: decreased standing balance due to vestibular impairments Mobility    close S due to decreased balance from vestibular deficits Trunk/Postural Assessment  Cervical Assessment Cervical Assessment: Within Functional Limits Thoracic Assessment Thoracic Assessment: Within Functional Limits Lumbar Assessment Lumbar Assessment: Within Functional Limits Postural Control Protective Responses: improved from eval, adequate  Balance Static Sitting Balance Static Sitting - Level of Assistance: 7: Independent Dynamic Sitting Balance Dynamic Sitting - Level of Assistance: 7: Independent Static Standing Balance Static Standing - Level of Assistance: 5: Stand by assistance Dynamic Standing Balance Dynamic Standing - Level of Assistance: 5: Stand by assistance Extremity/Trunk Assessment RUE Assessment RUE Assessment: Within Functional Limits LUE Assessment LUE Assessment: Within Functional Limits   See Function Navigator for Current Functional Status.  [place] 03/22/2016, 1:27 PM

## 2016-03-22 NOTE — Progress Notes (Signed)
Occupational Therapy Note  Patient Details  Name: Teodoro SprayHeather Bernhard MRN: [id number] Date of Birth: 06/14/1991  Today's Date: 03/22/2016 OT Individual Time: 1330-1430 OT Individual Time Calculation (min): 60 min    Pt denied pain Individual Therapy  Pt initially engaged in kitchen activity with focus on following directions, sequencing, attention to task, and safety in the kitchen.  Pt followed directions correctly for mixing and preparing cookies from cookie mix package.  Pt persistently asked for assurance that she was completing task correctly.  Pt's Mom joined session.  Reviewed "Dos and Don'ts" lists with Mom and pt.  Mom and pt stated that these lists would be posted on refrigerator and placed in pt's bedroom at home.  Pt agreed that the lists were reasonable and she said she would be able to follow the guidelines.  Pt returned to room and remained seated on bed with Mom present.   Lavone NeriLanier, Paisyn Guercio [facility] 03/22/2016, 2:42 PM
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
## 2016-03-23 ENCOUNTER — Inpatient Hospital Stay (HOSPITAL_COMMUNITY): Payer: Self-pay | Admitting: Physical Therapy

## 2016-03-23 ENCOUNTER — Inpatient Hospital Stay (HOSPITAL_COMMUNITY): Payer: Self-pay | Admitting: Speech Pathology

## 2016-03-23 ENCOUNTER — Inpatient Hospital Stay (HOSPITAL_COMMUNITY): Payer: Medicaid Other | Admitting: Occupational Therapy

## 2016-03-23 LAB — BASIC METABOLIC PANEL
Anion gap: 5 (ref 5–15)
BUN: 10 mg/dL (ref 6–20)
CALCIUM: 9.1 mg/dL (ref 8.9–10.3)
CO2: 28 mmol/L (ref 22–32)
CREATININE: 0.74 mg/dL (ref 0.44–1.00)
Chloride: 106 mmol/L (ref 101–111)
GFR calc non Af Amer: >60 mL/min (ref >60)
GLUCOSE: 98 mg/dL (ref 65–99)
Potassium: 4.3 mmol/L (ref 3.5–5.1)
Sodium: 139 mmol/L (ref 135–145)

## 2016-03-23 LAB — VALPROIC ACID LEVEL: Valproic Acid Lvl: 47 ug/mL — ABNORMAL LOW (ref 50.0–100.0)

## 2016-03-23 NOTE — Progress Notes (Signed)
Subjective/Complaints: Was upset yesterday when she thought she could go home at 10:00 and was reminded it was actually 5:00 today. Feeling better about that this morning but now feeling nauseas after smelling her breakfast.    Review systems: Denies shortness of breath, vomiting, diarrhea or constipation. Limited by cognition, poor attention and perseveration, no suicidal plan  Objective: Vital Signs: Blood pressure (!) 96/54, pulse 61, temperature 98.6 F (37 C), temperature source Oral, resp. rate 18, height 6\' 2"  (1.88 m), weight 92.2 kg (203 lb 4.2 oz), last menstrual period 02/29/2016, SpO2 98 %. No results found. No results found for this or any previous visit (from the past 72 hour(s)).    General: No acute distress ENT:  Right ear appears normal external canal looks clean , no dried blood or other fluid noted, no stain on pillowcase Mood and affect are appropriate Heart: Regular rate and rhythm no rubs murmurs or extra sounds Lungs: Clear to auscultation, breathing unlabored, no rales or wheezes Abdomen: Positive bowel sounds, soft nontender to palpation, nondistended Extremities: No clubbing, cyanosis, or edema Skin: small scrape on dorsum of right foot/ mild associated swelling Neurologic: Cranial nerves II through XII intact except for hearing right ear (dried blood however in ext canal).  motor strength is 5/5 in bilateral deltoid, bicep, tricep, grip, hip flexor, knee extensors, ankle dorsiflexor and plantar flexor Sensory exam normal sensation to light touch and proprioception in bilateral upper and lower extremities Cerebellar exam normal finger to nose to finger as well as heel to shin in bilateral upper and lower extremities Pt impulsive, easily distracted/attention waxes and wanes. Tearful/emotional. Non agitated. Needs frequent redirection. Perseverative--SOME IMPROVEMENT Musculoskeletal: Full range of motion in all 4 extremities. No joint  swelling   Assessment/Plan: 1. 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 Physiatrist is providing close team supervision and 24 hour management of active medical problems listed below. Physiatrist and rehab team continue to assess barriers to discharge/monitor patient progress toward functional and medical goals.  FIM: Function - Bathing Position: Shower Body parts bathed by patient: Right arm, Left arm, Chest, Abdomen, Front perineal area, Buttocks, Right upper leg, Right lower leg, Left upper leg, Left lower leg Body parts bathed by helper: Back Bathing not applicable: Back Assist Level: Supervision or verbal cues  Function- Upper Body Dressing/Undressing What is the patient wearing?: Bra, Pull over shirt/dress Bra - Perfomed by patient: Thread/unthread right bra strap, Thread/unthread left bra strap, Hook/unhook bra (pull down sports bra) Bra - Perfomed by helper: Hook/unhook bra (pull down sports bra) Pull over shirt/dress - Perfomed by patient: Thread/unthread right sleeve, Put head through opening, Thread/unthread left sleeve, Pull shirt over trunk Assist Level: Supervision or verbal cues Function - Lower Body Dressing/Undressing What is the patient wearing?: Underwear, Pants, Socks, Shoes Position: Sitting EOB Underwear - Performed by patient: Thread/unthread right underwear leg, Thread/unthread left underwear leg, Pull underwear up/down Pants- Performed by patient: Thread/unthread right pants leg, Thread/unthread left pants leg, Pull pants up/down Pants- Performed by helper: Thread/unthread right pants leg, Thread/unthread left pants leg (pt with severe headache and requested A) Non-skid slipper socks- Performed by patient: Don/doff right sock, Don/doff left sock Socks - Performed by patient: Don/doff right sock,  Don/doff left sock Socks - Performed by helper: Don/doff right sock Shoes - Performed by patient: Don/doff right shoe, Don/doff left shoe Shoes - Performed by helper: Don/doff  right shoe Assist for footwear: Supervision/touching assist Assist for lower body dressing: Supervision or verbal cues Set up : To obtain clothing/put away  Function - Toileting Toileting steps completed by patient: Adjust clothing prior to toileting, Performs perineal hygiene, Adjust clothing after toileting Toileting steps completed by helper: Performs perineal hygiene Toileting Assistive Devices: Grab bar or rail Assist level: Supervision or verbal cues  Function - ArchivistToilet Transfers Toilet transfer assistive device: Grab bar Assist level to toilet: Supervision or verbal cues Assist level from toilet: Supervision or verbal cues Assist level to bedside commode (at bedside): Supervision or verbal cues Assist level from bedside commode (at bedside): Supervision or verbal cues  Function - Chair/bed transfer Chair/bed transfer method: Ambulatory Chair/bed transfer assist level: Supervision or verbal cues Chair/bed transfer assistive device: Armrests Chair/bed transfer details: Verbal cues for precautions/safety  Function - Locomotion: Wheelchair Will patient use wheelchair at discharge?: No Function - Locomotion: Ambulation Assistive device: No device Max distance: >200 ft Assist level: Supervision or verbal cues Assist level: Supervision or verbal cues Assist level: Supervision or verbal cues Assist level: Supervision or verbal cues Assist level: Supervision or verbal cues  Function - Comprehension Comprehension: Auditory Comprehension assist level: Understands basic 90% of the time/cues < 10% of the time  Function - Expression Expression: Verbal Expression assist level: Expresses basic 90% of the time/requires cueing < 10% of the time.  Function - Social Interaction Social Interaction assist level:  Interacts appropriately 50 - 74% of the time - May be physically or verbally inappropriate.  Function - Problem Solving Problem solving assist level: Solves basic 75 - 89% of the time/requires cueing 10 - 24% of the time  Function - Memory Memory assist level: Recognizes or recalls 25 - 49% of the time/requires cueing 50 - 75% of the time Patient normally able to recall (first 3 days only): Current season, That he or she is in a hospital, Staff names and faces   Medical Problem List and Plan: 1. 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  - cont CIR PT, OT, SLP, Neuropsych   -discussed later discharge and she's fine with it now.  2. DVT Prophylaxis/Anticoagulation: Mechanical: Sequential compression devices, below knee Bilateral lower extremities 3. Pain Management: oxycodone 5mg  Q4H prn 4. Mood: emotional lability--improved appreciate psych consult  -continue psychosocial support, family ed  -TBI related agitation, continue day time seroquel 25mg  bid in addition to hs seroquel which I increased to 50mg   -TBI related agitation titrated depakote to 500mg  bid-- level pending today  -ritalin stopped which appears to have been trigger for outbursts last week  -increased prozac to 40mg  daily  -meds overall effective.   -reduced   klonopin to 0.5mg  tid 5. Neuropsych: This patient is not capable of making decisions on her own behalf.  - attention deficits--although her attention is a major factor, cannot risk exacerbating her emotional state---ritalin stopped 6. Skin/Wound Care: local care/bandage to foot---reassured pt 7. Fluids/Electrolytes/Nutrition: encourage po 8.  ?Seizure taken off Keppra by NS, will monitor 9.  Pulmonary contusions as well as aspiration pneumonia. 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 Tachycardia with episode of A  flutter was on beta blocker as recommended by cardiology Dr Delton SeeNelson   12. Insomnia. seroquel as above  -improved sleep "hygiene" in general 13. 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

## 2016-03-23 NOTE — Progress Notes (Signed)
Pt. Has been vomiting today.She verbalized that she is not feeling good.MD and PA are aware,keep monitoring pt..Marland Kitchen

## 2016-03-23 NOTE — Progress Notes (Signed)
Social Work Discharge Note  The overall goal for the admission was met for:   Discharge location: Yes - home with mother and extended family  Length of Stay: Yes - 15 days  Discharge activity level: Yes - 24/7 supervision  Home/community participation: Yes  Services provided included: MD, RD, PT, OT, SLP, RN, Pharmacy, Neuropsych and SW  Financial Services: Other: Pt applied for Medicaid during her stay.  Follow-up services arranged: Home Health: PT/OT/SLP, DME: shower seat and Patient/Family has no preference for HH/DME agencies from [facility] (or additional information): CSW made referrals for pt for home therapies, MATCH program, [facility], and [facility] and [employer].  Other resources for pt/family to f/u on were included in her d/c instructions and this included:  Crisis line, BI support group, [place], [facility], [place], Neuropsychology.  Pt's family to provide 24/7 supervision.    Patient/Family verbalized understanding of follow-up arrangements: Yes  Individual responsible for coordination of the follow-up plan: pt's mother and uncle  Confirmed correct DME delivered: Trey Sailors 03/23/2016    Ahmon Tosi, Silvestre Mesi

## 2016-03-23 NOTE — Progress Notes (Signed)
Physical Therapy Session Note  Patient Details  Name: Theresa Henderson MRN: [id number] Date of Birth: [birth year]  Today's Date: 03/23/2016 PT Individual Time: 1400-1417 PT Individual Time Calculation (min): 17 min    Short Term Goals: Week 2:  PT Short Term Goal 1 (Week 2): = LTGs due to anticipated LOS  Skilled Therapeutic Interventions/Progress Updates:   Pt resting in bed on PT arrival, c/o nausea/vomitting and perseverating on calling mother to go home.  Pt declining out of bed therapy.  PT engaged pt in conversation regarding upcoming d/c and discussed packing up room, transitioning home, and ongoing N/V.  Pt interacted appropriately during conversation with min redirection required when becoming perseverative on calling mother.  Pt left supine in bed with call bell in reach and needs met. Missed 43 minutes of therapy session.    Therapy Documentation Precautions:  Precautions Precautions: Fall Precaution Comments: impulsive Restrictions Weight Bearing Restrictions: No General: PT Amount of Missed Time (min): 43 Minutes PT Missed Treatment Reason: Patient ill (Comment);Patient unwilling to participate (nauseated, awaiting d/c today)   See Function Navigator for Current Functional Status.   Therapy/Group: Individual Therapy  Earnest Conroy Penven-Crew 03/23/2016, 3:36 PM

## 2016-03-23 NOTE — Plan of Care (Signed)
Problem: RH Memory Goal: LTG Patient demonstrate ability for day to day recall (PT) LTG:  Patient will demonstrate ability for day to day recall/carryover during mobility activities with assist (PT)  Outcome: Not Met (add Reason) Requires total assist for day-to-day recall

## 2016-03-23 NOTE — Progress Notes (Signed)
Speech Language Pathology Discharge Summary  Patient Details  Name: Theresa Henderson MRN: [id number] Date of Birth: 08-08-1990  Today's Date: 03/23/2016  Patient has met 5 of 7 long term goals.  Patient to discharge at overall Min;Mod level.   Reasons goals not met: Patient continues to require overall Max-Total A for recall and Mod A for intellectual awareness of deficits.    Clinical Impression/Discharge Summary: Patient has made functional gains and has met 5 of 7 LTG's this admission due to improved cognitive and swallowing function. Currently, patient demonstrates behaviors consistent with a Rancho Level VII and requires overall Min A to complete functional and familiar tasks safely in regards to sustained attention and problem solving, Mod A for awareness and Max-Total A for recall. Patient is also consuming regular textures with thin liquids without overt s/s of aspiration with Mod I for use of swallowing compensatory strategies. Patient and family education is complete and patient will discharge home with 24 hour supervision from family. Patient would benefit from f/u SLP services to maximize her cognitive function and overall functional independence in order to reduce caregiver burden.   Care Partner:  Caregiver Able to Provide Assistance: Yes  Type of Caregiver Assistance: Physical;Cognitive  Recommendation:  Home Health SLP;24 hour supervision/assistance  Rationale for SLP Follow Up: Reduce caregiver burden;Maximize cognitive function and independence   Equipment: N/A   Reasons for discharge: Discharged from hospital   Patient/Family Agrees with Progress Made and Goals Achieved: Yes    [place], [place] 03/23/2016, 7:06 AM

## 2016-03-23 NOTE — Progress Notes (Signed)
Occupational Therapy Session Note  Patient Details  Name: Theresa Henderson MRN: [id number] Date of Birth: 05/07/1991  Today's Date: 03/23/2016 OT Individual Time: 3419-6222 OT Individual Time Calculation (min): 60 min     Short Term Goals:Week 1:  OT Short Term Goal 1 (Week 1): Pt will complete grooming standing at sink level with min assistance for dynamic balance. OT Short Term Goal 1 - Progress (Week 1): Met OT Short Term Goal 2 (Week 1): Pt will tolerate 30 minutes of therapeutic intervention with no emotional outbursts, as appropriate. OT Short Term Goal 2 - Progress (Week 1): Progressing toward goal OT Short Term Goal 3 (Week 1): Pt will don socks and shoes with supervision for increase in LB dressing independence. OT Short Term Goal 3 - Progress (Week 1): Met OT Short Term Goal 4 (Week 1): Pt will attend to 3 minutes of cognitive task with zero need for redirection. OT Short Term Goal 4 - Progress (Week 1): Progressing toward goal Week 2:  OT Short Term Goal 1 (Week 2): STGs = LTGs  Skilled Therapeutic Interventions/Progress Updates:    Pt seen for skilled OT to facilitate safety awareness with mobility during ADL retraining.  Pt very upset, crying and vomiting as she was worried she would not be allowed to go home today. Pt vomited a significant amount, reported to RN. Reassured pt she would go home today. Pt did participate in B/D at shower level with cues to not turn head as she was walking.  Pt very concerned she would continue to be sick and layed down in bed at end of session. Bed alarm set. Call light in reach.    Therapy Documentation Precautions:  Precautions Precautions: Fall Precaution Comments: impulsive Restrictions Weight Bearing Restrictions: No   Pain: Pain Assessment Pain Score: 2  ADL: See Function Navigator for Current Functional Status.   Therapy/Group: Individual Therapy  [place] 03/23/2016, 12:04 PM

## 2016-03-23 NOTE — Progress Notes (Signed)
SLP Cancellation Note  Patient Details Name: Teodoro SprayHeather Mcnorton MRN: [id number] DOB: 1991/03/20   Cancelled treatment: Patient missed 60 minutes of skilled SLP intervention due to feeling ill. Patient reports nausea with vomiting. RN aware. Patient is scheduled to d/c home today.                                                                                                Yaslene Lindamood 03/23/2016, 11:33 AM

## 2016-03-23 NOTE — Discharge Instructions (Signed)
Inpatient Rehab Discharge Instructions  Hoang Reich Discharge date and time:    Activities/Precautions/ Functional Status: Activity: no lifting, driving, or strenuous exercise for till cleared by MD Diet: cardiac diet Wound Care: none needed   Functional status:  ___ No restrictions     ___ Walk up steps independently ___ 24/7 supervision/assistance   ___ Walk up steps with assistance ___ Intermittent supervision/assistance  ___ Bathe/dress independently ___ Walk with walker     ___ Bathe/dress with assistance ___ Walk Independently    ___ Shower independently ___ Walk with assistance    ___ Shower with assistance ___ No alcohol     ___ Return to work/school ________  COMMUNITY REFERRALS UPON DISCHARGE:   Home Health:   PT     OT     ST     Agency:  [facility] Phone:  [phone] Medical Equipment/Items Ordered:  Shower seat with back  Agency/Supplier:  [facility]        Phone:  [phone] Other:  [facility]                 Raymond Gurney - [phone] Medication assistance:  MATCH program - must take card Boneta Lucks gave you to a pharmacy on approved list with the prescriptions.  It will only provide one months supply for most medicines with a $3 co-pay per medication.  Oxycodone will only be for 14 days.  Klonopin is not covered under the [facility] program.  GENERAL COMMUNITY RESOURCES FOR PATIENT/FAMILY: Support Groups:  [facility]                              Meets the second Tuesday of the month 7pm-8:30pm                              For more information, call Amada Jupiter  [phone]   Caregiver Support:  [employer] [place]; www.brainline.org Mental Health:  Crisis Line  [phone]  Counseling/Medication Management:  [facility]                                                              [address]                                                              [place],  [place]  [address]                                                              [phone]  [facility]                                                              [address]                                                              [place], [place]  [address]                                                              [phone] Neuropsychologist:  Dr. Orie FishermanAdam McDermott, PsyD - saw you in the hospital                                   [address]                                  [place], [place]  [address]                                  [phone]   Special Instructions: 1. 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  2.   My questions have been answered and I understand these instructions. I will adhere to these goals and the provided educational materials after my discharge from the hospital.  Patient/Caregiver Signature _______________________________ Date __________  Clinician Signature _______________________________________ Date __________  Please bring this form and your medication list with you to all your follow-up doctor's appointments.     FOR SAFETY, 24 HOUR SUPERVISION IS RECOMMENDED UNTIL OTHERWISE DIRECTED BY A PHYSICIAN.  ABSOLUTELY NO DRIVING UNTIL OTHERWISE DIRECTED BY A PHYSICIAN. CAR KEYS SHOULD BE HIDDEN IN A SAFE LOCATION IF NEEDED.  DUE TO RISK OF INJURY, PLEASE REMOVE GUNS, KNIVES, OR ANY OTHER POTENTIALLY DANGEROUS OBJECTS AND HAZARDOUS MATERIALS FROM THE HOME.

## 2016-03-23 NOTE — Progress Notes (Signed)
Pt. And family got d/c orders and prescriptions.Pt. Ready to go home with her family.
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
## 2016-03-27 ENCOUNTER — Telehealth (HOSPITAL_COMMUNITY): Payer: Self-pay

## 2016-03-27 NOTE — Telephone Encounter (Signed)
Told Mom that trauma follow up can be prn as long as she has follow up with Dr. Franky Machoabbell and PCP.

## 2016-03-28 ENCOUNTER — Encounter: Payer: Self-pay | Admitting: Internal Medicine

## 2016-03-28 ENCOUNTER — Ambulatory Visit: Payer: Medicaid Other | Attending: Internal Medicine | Admitting: Internal Medicine

## 2016-03-28 ENCOUNTER — Other Ambulatory Visit: Payer: Self-pay | Admitting: *Deleted

## 2016-03-28 VITALS — BP 87/55 | HR 79 | Temp 98.2°F | Resp 18 | Ht 74.0 in | Wt 208.2 lb

## 2016-03-28 DIAGNOSIS — F419 Anxiety disorder, unspecified: Secondary | ICD-10-CM | POA: Diagnosis not present

## 2016-03-28 DIAGNOSIS — H9191 Unspecified hearing loss, right ear: Secondary | ICD-10-CM | POA: Diagnosis not present

## 2016-03-28 DIAGNOSIS — F329 Major depressive disorder, single episode, unspecified: Secondary | ICD-10-CM | POA: Insufficient documentation

## 2016-03-28 DIAGNOSIS — R519 Headache, unspecified: Secondary | ICD-10-CM

## 2016-03-28 DIAGNOSIS — R51 Headache: Secondary | ICD-10-CM

## 2016-03-28 DIAGNOSIS — Z87820 Personal history of traumatic brain injury: Secondary | ICD-10-CM | POA: Diagnosis not present

## 2016-03-28 DIAGNOSIS — S069X9S Unspecified intracranial injury with loss of consciousness of unspecified duration, sequela: Secondary | ICD-10-CM

## 2016-03-28 DIAGNOSIS — N920 Excessive and frequent menstruation with regular cycle: Secondary | ICD-10-CM | POA: Diagnosis not present

## 2016-03-28 DIAGNOSIS — I483 Typical atrial flutter: Secondary | ICD-10-CM

## 2016-03-28 DIAGNOSIS — F0151 Vascular dementia with behavioral disturbance: Secondary | ICD-10-CM | POA: Insufficient documentation

## 2016-03-28 DIAGNOSIS — F0281 Dementia in other diseases classified elsewhere with behavioral disturbance: Secondary | ICD-10-CM

## 2016-03-28 DIAGNOSIS — F418 Other specified anxiety disorders: Secondary | ICD-10-CM

## 2016-03-28 DIAGNOSIS — S069XAS Unspecified intracranial injury with loss of consciousness status unknown, sequela: Secondary | ICD-10-CM

## 2016-03-28 MED ORDER — FLUOXETINE HCL 40 MG PO CAPS: 40.0000 mg | ORAL_CAPSULE | Freq: Every day | ORAL | 3 refills | Status: DC

## 2016-03-28 MED ORDER — QUETIAPINE FUMARATE 25 MG PO TABS: ORAL_TABLET | ORAL | 0 refills | Status: DC

## 2016-03-28 MED ORDER — DIVALPROEX SODIUM 500 MG PO DR TAB: 500.0000 mg | DELAYED_RELEASE_TABLET | Freq: Two times a day (BID) | ORAL | 1 refills | Status: DC

## 2016-03-28 MED ORDER — CLONAZEPAM 0.5 MG PO TABS: 0.5000 mg | ORAL_TABLET | Freq: Three times a day (TID) | ORAL | 0 refills | Status: DC

## 2016-03-28 MED ORDER — TRAMADOL HCL 50 MG PO TABS: 50.0000 mg | ORAL_TABLET | Freq: Four times a day (QID) | ORAL | 0 refills | Status: AC | PRN

## 2016-03-28 MED ORDER — ACETAMINOPHEN 325 MG PO TABS: 650.0000 mg | ORAL_TABLET | ORAL | 2 refills | Status: AC | PRN

## 2016-03-28 MED ORDER — TRAMADOL HCL 50 MG PO TABS: 50.0000 mg | ORAL_TABLET | Freq: Four times a day (QID) | ORAL | 0 refills | Status: DC | PRN

## 2016-03-28 MED FILL — DIVALPROEX SOD DR 500 MG TA: 500 | 30 days supply | Qty: 60 | Fill #0

## 2016-03-28 MED FILL — ?QUETIAPINE FUMARATE 25 MG: 25 MG | 30 days supply | Qty: 120 | Fill #0

## 2016-03-28 MED FILL — traMADol HCL 50 MG TABS: 50 | 7 days supply | Qty: 30 | Fill #0

## 2016-03-28 MED FILL — FLUoxetine HCL 40 MG CAPS: 40 | 30 days supply | Qty: 30 | Fill #0

## 2016-03-28 NOTE — Assessment & Plan Note (Signed)
Change daily oxycodone to tramadol as parent reports patient using same in hospital with good control. Also reassured okay to use Tylenol as needed. New prescriptions provided. Also follow-up with ENT and PMNR as planned

## 2016-03-28 NOTE — Assessment & Plan Note (Signed)
Ongoing neuropsych behavioral care reviewed. Exacerbation of premorbid conditions by TBI. Continue current psychiatric medications for depression and anxiety

## 2016-03-28 NOTE — Patient Instructions (Signed)
It was good to see you today.  We have reviewed your prior records including labs and tests today  Medications reviewed and updated -stop oxycodone and instead use tramadol and/or Tylenol for headache pain  Refill on other medication(s) as discussed today.  we'll make referral to ENT and gynecology for your problems as discussed. Our office will contact you regarding appointment(s) once made.  Please schedule followup in 6-8 weeks for review, call sooner if problems.

## 2016-03-28 NOTE — Telephone Encounter (Signed)
PRINTED

## 2016-03-28 NOTE — Assessment & Plan Note (Signed)
Noted during critical illness following TBI in ICU. Tachycardia now seems resolved and patient experiencing fatigue with symptomatic hypotension. Will discontinue low-dose beta blocker at this time and continue observation. No specific cardiology follow up planned at this time

## 2016-03-28 NOTE — Progress Notes (Signed)
Patient is here for TBI FU  Patient complains of intermittent right ear bleeding. Sometimes soaking 3 gauze pads.  Patient complains of AM HA's being present with dizziness. Patient takes Oxycodone 5 mg 12 hour release for pain which provides daily relief until the next morning.  Patient has taken medication today and patient has eaten today.
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
## 2016-03-30 ENCOUNTER — Telehealth: Payer: Self-pay | Admitting: *Deleted

## 2016-03-30 NOTE — Telephone Encounter (Signed)
Stacy called to inform Dr [name] that Theresa Henderson was having issues with severe ear pain and some drainage, dizziness, and she had a fall without injury. She called her PCP @ [facility] and they referred there to ENT, so this is more of an [place]. Her appt with you is 04/16/16

## 2016-03-30 NOTE — Telephone Encounter (Signed)
Sh was also referred to an ENT prior to discharge from CIR

## 2016-04-10 ENCOUNTER — Telehealth: Payer: Self-pay | Admitting: Internal Medicine

## 2016-04-10 DIAGNOSIS — S069X9S Unspecified intracranial injury with loss of consciousness of unspecified duration, sequela: Secondary | ICD-10-CM

## 2016-04-10 DIAGNOSIS — R441 Visual hallucinations: Secondary | ICD-10-CM
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
## 2016-04-11 ENCOUNTER — Ambulatory Visit: Payer: Medicaid Other | Attending: Internal Medicine

## 2016-04-11 DIAGNOSIS — F418 Other specified anxiety disorders: Secondary | ICD-10-CM

## 2016-04-11 DIAGNOSIS — S069X3D Unspecified intracranial injury with loss of consciousness of 1 hour to 5 hours 59 minutes, subsequent encounter: Secondary | ICD-10-CM

## 2016-04-11 DIAGNOSIS — S0219XD Other fracture of base of skull, subsequent encounter for fracture with routine healing: Secondary | ICD-10-CM

## 2016-04-11 DIAGNOSIS — S27329D Contusion of lung, unspecified, subsequent encounter: Secondary | ICD-10-CM
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
## 2016-04-13 ENCOUNTER — Telehealth: Payer: Self-pay | Admitting: Physical Medicine & Rehabilitation

## 2016-04-13 NOTE — Telephone Encounter (Signed)
Patient has an appointment on Monday, but mother is concerned about patient's depression.  She seems to think that the Prozac isn't working and would like to know if something else can be prescribed.  Please call Kim at [phone].

## 2016-04-13 NOTE — Telephone Encounter (Signed)
She saw Dr Felicity CoyerLeschber but was told it was hospital followup and would not be seeing again. Her mother is concerned about her not sleeping (even with the quetiapine) and her depression is much worse and does not think the prozac is helping.  I have instructed her that Dr [name] is not in the office but I will send the message to him.  (she has appt Monday).  If we do not have an answer today and she becomes increasingly concerned about her depression, she would need to take to the ER for evaluation.  Dr [name] please advise

## 2016-04-16 ENCOUNTER — Other Ambulatory Visit: Payer: Self-pay | Admitting: Internal Medicine

## 2016-04-16 ENCOUNTER — Encounter: Payer: Self-pay | Admitting: Physical Medicine & Rehabilitation

## 2016-04-16 ENCOUNTER — Encounter: Payer: Medicaid Other | Attending: Physical Medicine & Rehabilitation | Admitting: Physical Medicine & Rehabilitation

## 2016-04-16 VITALS — BP 110/72 | HR 99 | Resp 14

## 2016-04-16 DIAGNOSIS — F09 Unspecified mental disorder due to known physiological condition: Secondary | ICD-10-CM | POA: Diagnosis not present

## 2016-04-16 DIAGNOSIS — S069X9S Unspecified intracranial injury with loss of consciousness of unspecified duration, sequela: Secondary | ICD-10-CM

## 2016-04-16 DIAGNOSIS — Z79899 Other long term (current) drug therapy: Secondary | ICD-10-CM | POA: Diagnosis not present

## 2016-04-16 DIAGNOSIS — S069X3S Unspecified intracranial injury with loss of consciousness of 1 hour to 5 hours 59 minutes, sequela: Secondary | ICD-10-CM

## 2016-04-16 DIAGNOSIS — Z87820 Personal history of traumatic brain injury: Secondary | ICD-10-CM | POA: Diagnosis not present

## 2016-04-16 DIAGNOSIS — F02B18 Dementia in other diseases classified elsewhere, moderate, with other behavioral disturbance: Secondary | ICD-10-CM

## 2016-04-16 DIAGNOSIS — F0281 Dementia in other diseases classified elsewhere with behavioral disturbance: Secondary | ICD-10-CM

## 2016-04-16 DIAGNOSIS — S069X0S Unspecified intracranial injury without loss of consciousness, sequela: Secondary | ICD-10-CM

## 2016-04-16 DIAGNOSIS — F29 Unspecified psychosis not due to a substance or known physiological condition: Secondary | ICD-10-CM

## 2016-04-16 DIAGNOSIS — F418 Other specified anxiety disorders: Secondary | ICD-10-CM

## 2016-04-16 MED ORDER — FLUOXETINE HCL 20 MG PO CAPS: 60.0000 mg | ORAL_CAPSULE | Freq: Every day | ORAL | 3 refills | Status: AC

## 2016-04-16 MED ORDER — TRAMADOL HCL 50 MG PO TABS: 50.0000 mg | ORAL_TABLET | Freq: Two times a day (BID) | ORAL | 1 refills | Status: DC | PRN

## 2016-04-16 MED ORDER — CLONAZEPAM 1 MG PO TABS: 1.0000 mg | ORAL_TABLET | Freq: Three times a day (TID) | ORAL | 3 refills | Status: AC

## 2016-04-16 MED FILL — FLUoxetine HCL 20 MG CAPS: 20 | 30 days supply | Qty: 90 | Fill #0 | Status: TO

## 2016-04-16 NOTE — Patient Instructions (Addendum)
PLEASE CALL ME WITH ANY PROBLEMS OR QUESTIONS [phone])  --- PLEASE LIMIT YOUR NAPS DURING THE DAY TO 30-45 MINUTES---SET A TIMER/ALARM!!!   TARGET 8-10 HOURS OF SLEEP AT NIGHT  WORK ON A SET TIME TO GO TO BED-----?10PM  PRACTICE GOOD SLEEP HYGIENE: BACKGROUND NOISE, MUSIC, NON-DISTRACTED ROOM. TV OFF. TRY READING A BOOK OR MAGAZINE TO HELP DISTRACT

## 2016-04-16 NOTE — Progress Notes (Signed)
Subjective:    Patient ID: Theresa Henderson, female    DOB: 1991-06-25, 25 y.o.   MRN: [id number]  HPI She reports a seizure (not witnessed) but she was aware of what was happenin gin her bedroom-couldn't call for help. After event she couldn't get settled back down, couldn't sleep. Mom reports that she had an emotional day prior.   [name] is here in follow up of her TBI. She has been having problems with worsening depression, anxiety. She has had thoughts of hurting herself---especially at night. She typically hasn't had thoughts of carrying through.   Her memory is impaired from day to day but has improved. She doesn't recall any of her rehab stay. She sometimes has problems with names. She uses a calendar and memory log.   From a balance standpoint, she still has dizziness. The symptoms are intermittent and worst in the morning and evening.  Her hearing is still impaired on the right side. They haven't been able to get into an ENT.  She takes naps during the day and has problems trying to fall asleep at night. Her sleep is often broken.     Pain Inventory Average Pain 0 Pain Right Now 2 My pain is headache  In the last 24 hours, has pain interfered with the following? General activity 0 Relation with others 1 Enjoyment of life 0 What TIME of day is your pain at its worst? varies Sleep (in general) Poor  Pain is worse with: unsure Pain improves with: nothing Relief from Meds: tylenol only  Mobility walk without assistance  Function disabled: date disabled . I need assistance with the following:  meal prep, household duties and shopping  Neuro/Psych tremor dizziness confusion depression anxiety  Prior Studies Any changes since last visit?  no  Physicians involved in your care Any changes since last visit?  no   Family History  Problem Relation Age of Onset  . Hypertension Mother   . Hyperlipidemia Mother   . Hypertension Father   . Hyperlipidemia Father     Social History   Social History  . Marital status: Single    Spouse name: N/A  . Number of children: N/A  . Years of education: N/A   Social History Main Topics  . Smoking status: Never Smoker  . Smokeless tobacco: Never Used  . Alcohol use None  . Drug use: No  . Sexual activity: Not Asked   Other Topics Concern  . None   Social History Narrative  . None   No past surgical history on file. Past Medical History:  Diagnosis Date  . Asthma   . Depression    BP 110/72   Pulse 99   Resp 14   SpO2 98%   Opioid Risk Score:   Fall Risk Score:  `1  Depression screen PHQ 2/9  Depression screen PHQ 2/9 04/13/2016  Decreased Interest 3  Down, Depressed, Hopeless 3  PHQ - 2 Score 6  Altered sleeping 3  Tired, decreased energy 3  Change in appetite 1  Feeling bad or failure about yourself  3  Trouble concentrating 3  Moving slowly or fidgety/restless 3  Suicidal thoughts 2  PHQ-9 Score 24     Review of Systems  Neurological: Positive for seizures.  All other systems reviewed and are negative.      Objective:   Physical Exam   General: No acute distress ENT:  Right ear appears normal external canal looks clean , no dried blood or other fluid  noted, no stain on pillowcase Mood and affect are appropriate Heart: Regular rate and rhythm no rubs murmurs or extra sounds Lungs: Clear to auscultation, breathing unlabored, no rales or wheezes Abdomen: Positive bowel sounds, soft nontender to palpation, nondistended Extremities: No clubbing, cyanosis, or edema Skin: small scrape on dorsum of right foot/ mild associated swelling Neurologic: Cranial nerves II through XII intact except for hearing right ear 50% less than left.  motor strength is 5/5 in bilateral deltoid, bicep, tricep, grip, hip flexor, knee extensors, ankle dorsiflexor and plantar flexor Sensory exam normal sensation to light touch and proprioception in bilateral upper and lower extremities Cerebellar  exam normal finger to nose to finger as well as heel to shin in bilateral upper and lower extremities emotionallyt much better. Still distracted but redirectable. Less verbose and tangential. Less emotionally labile.  Musculoskeletal: Full range of motion in all 4 extremities. No joint swelling  Medical Problem List and Plan: 1. 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 Pain Management: tramadol 50mg  prn for h/a---these are improving 3. Mood: emotional lability--improved appreciate psych consult           -continue psychosocial support, family ed           -increase klonopin to 1mg  tid  -increase prozac to 60mg  daily  -maintain seroquel and vpa at current doses  -needs outpt psych at some point  -family remains supportive 4. Sleep-  -needs to restore normal sleep-wake cycle  -discussed appropriate sleep hygiene  -discussed means of assisting sleep  -avoid excessive napping during the day  -increased klonopin as a above which should help with sleep 5. Hearing---will check with ENT regarding a visit---has no funds for appt at this time  -this is a non-emergent situation at present  6. ?recent seizure---this does not appear to be consistent with a seizure- no post-ictal presentation either  -think it is tremor, anxiety  -klonopin  -observe  Follow up in a month. Forty-five minutes of face to face patient care time were spent during this visit. All questions were encouraged and answered.        Assessment & Plan:

## 2016-04-17 MED FILL — traMADol HCL 50 MG TABS: 50 | 30 days supply | Qty: 60 | Fill #0

## 2016-04-18 ENCOUNTER — Other Ambulatory Visit (HOSPITAL_COMMUNITY): Payer: Self-pay | Admitting: Neurosurgery

## 2016-04-18 ENCOUNTER — Other Ambulatory Visit: Payer: Self-pay | Admitting: Neurosurgery

## 2016-04-18 ENCOUNTER — Other Ambulatory Visit: Payer: Self-pay | Admitting: Internal Medicine

## 2016-04-18 DIAGNOSIS — S069X9S Unspecified intracranial injury with loss of consciousness of unspecified duration, sequela: Secondary | ICD-10-CM

## 2016-04-18 DIAGNOSIS — F29 Unspecified psychosis not due to a substance or known physiological condition: Secondary | ICD-10-CM

## 2016-04-18 DIAGNOSIS — I7774 Dissection of vertebral artery: Secondary | ICD-10-CM

## 2016-04-24 ENCOUNTER — Ambulatory Visit: Payer: Self-pay | Admitting: Family Medicine

## 2016-05-01 ENCOUNTER — Other Ambulatory Visit: Payer: Self-pay | Admitting: Physical Medicine & Rehabilitation

## 2016-05-01 NOTE — Telephone Encounter (Signed)
Patient is having some real bad mood swings for past couple of days and not sleeping at night.  Would like to know what she can do for her daughter.  Please call Kim at [phone].

## 2016-05-02 ENCOUNTER — Telehealth: Payer: Self-pay | Admitting: Physical Medicine & Rehabilitation

## 2016-05-02 MED ORDER — DIVALPROEX SODIUM 500 MG PO DR TAB: DELAYED_RELEASE_TABLET | ORAL | 3 refills | Status: AC

## 2016-05-02 MED ORDER — DIVALPROEX SODIUM 250 MG PO DR TAB: DELAYED_RELEASE_TABLET | ORAL | 3 refills | Status: AC

## 2016-05-02 MED FILL — DIVALPROEX SOD 500 MG TAB D: 500 | 30 days supply | Qty: 60 | Fill #0 | Status: TO

## 2016-05-02 MED FILL — DIVALPROEX SOD DR 250 MG TA: 250 | 30 days supply | Qty: 60 | Fill #0 | Status: TO

## 2016-05-02 NOTE — Telephone Encounter (Signed)
Returned [employer]'s call

## 2016-05-02 NOTE — Telephone Encounter (Addendum)
Order placed and sent to pharmacy. Left message for Theresa BattenKim to call back.I spoke with her later and explained the medication regimen (500 + 250 twice daily) She is to call back if this does not help.

## 2016-05-02 NOTE — Telephone Encounter (Signed)
Increase depakote to 750mg  bid #60 3 rf

## 2016-05-07 ENCOUNTER — Ambulatory Visit: Payer: Self-pay | Admitting: Physical Medicine & Rehabilitation

## 2016-05-09 ENCOUNTER — Encounter: Payer: Medicaid Other | Attending: Physical Medicine & Rehabilitation | Admitting: Physical Medicine & Rehabilitation

## 2016-05-09 ENCOUNTER — Encounter: Payer: Self-pay | Admitting: Physical Medicine & Rehabilitation

## 2016-05-09 VITALS — BP 108/73 | HR 91

## 2016-05-09 DIAGNOSIS — S069X3S Unspecified intracranial injury with loss of consciousness of 1 hour to 5 hours 59 minutes, sequela: Secondary | ICD-10-CM | POA: Diagnosis not present

## 2016-05-09 DIAGNOSIS — F0281 Dementia in other diseases classified elsewhere with behavioral disturbance: Secondary | ICD-10-CM

## 2016-05-09 DIAGNOSIS — F09 Unspecified mental disorder due to known physiological condition: Secondary | ICD-10-CM | POA: Diagnosis not present

## 2016-05-09 DIAGNOSIS — F418 Other specified anxiety disorders: Secondary | ICD-10-CM

## 2016-05-09 DIAGNOSIS — S069X9S Unspecified intracranial injury with loss of consciousness of unspecified duration, sequela: Secondary | ICD-10-CM

## 2016-05-09 DIAGNOSIS — Z79899 Other long term (current) drug therapy: Secondary | ICD-10-CM | POA: Insufficient documentation

## 2016-05-09 DIAGNOSIS — F02B18 Dementia in other diseases classified elsewhere, moderate, with other behavioral disturbance: Secondary | ICD-10-CM

## 2016-05-09 DIAGNOSIS — Z87820 Personal history of traumatic brain injury: Secondary | ICD-10-CM | POA: Diagnosis present

## 2016-05-09 MED ORDER — TRAMADOL HCL 50 MG PO TABS: 50.0000 mg | ORAL_TABLET | Freq: Two times a day (BID) | ORAL | 1 refills | Status: AC | PRN

## 2016-05-09 MED ORDER — QUETIAPINE FUMARATE 25 MG PO TABS
ORAL_TABLET | ORAL | 0 refills | Status: DC
Start: 2016-05-09 — End: 2016-05-14

## 2016-05-09 NOTE — Patient Instructions (Addendum)
  CONTINUE TO WORK ON STRESS MANAGEMENT---FIND WAYS TO DECOMPRESS AND RECOGNIZE WHEN STRESS IS BUILDING.    TARGET 8-10 HOURS OF SLEEP PER NIGHT.   WORK ON TAKING ON MORE RESPONSIBILITIES.    HAVE A SCHEDULE!!  RETURN TO DRIVING PLAN:  WITH THE SUPERVISION OF A LICENSED DRIVER, PLEASE DRIVE IN AN EMPTY PARKING LOT FOR AT LEAST 2-3 TRIALS TO TEST REACTION TIME, VISION, USE OF EQUIPMENT IN CAR, ETC.  IF SUCCESSFUL WITH THE PARKING LOT DRIVING, PROCEED TO SUPERVISED DRIVING TRIALS IN YOUR NEIGHBORHOOD STREETS AT LOW TRAFFIC TIMES TO TEST OBSERVATION TO TRAFFIC SIGNALS, REACTION TIME, ETC. PLEASE ATTEMPT AT LEAST 2-3 TRIALS IN YOUR NEIGHBORHOOD.  IF NEIGHBORHOOD DRIVING IS SUCCESSFUL, YOU MAY PROCEED TO DRIVING IN BUSIER AREAS IN YOUR COMMUNITY WITH SUPERVISION OF A LICENSED DRIVER. PLEASE ATTEMPT AT LEAST 4-5 TRIALS.  IF COMMUNITY DRIVING IS SUCCESSFUL, YOU MAY PROCEED TO DRIVING ALONE, DURING THE DAY TIME, IN NON-PEAK TRAFFIC TIMES. YOU SHOULD DRIVE NO FURTHER THAN 15 MINUTES IN ONE DIRECTION. PLEASE DO NOT DRIVE IF YOU FEEL FATIGUED OR UNDER THE INFLUENCE OF MEDICATION.

## 2016-05-09 NOTE — Progress Notes (Signed)
Subjective:    Patient ID: Theresa Henderson, female    DOB: May 12, 1991, 25 y.o.   MRN: [id number]  HPI  Miamor is here in follow up of her TBI. She reports that her activity has increased. She goes out with friends and socializes. She spent 4 hours at the [employer] without issues. Everything is back to "normal". Her hearing is almost back to normal.   She had one episode where she had thoughts of harming herself---she states it was a movement where "she acted like a kid".  There was also an episode where she was overstimulated about a potential dog she wanted and had a severe headache and then "passed out."   Pain Inventory Average Pain 2 Pain Right Now 2 My pain is ?  In the last 24 hours, has pain interfered with the following? General activity 5 Relation with others 5 Enjoyment of life 2 What TIME of day is your pain at its worst? morning Sleep (in general) Fair  Pain is worse with: walking and some activites Pain improves with: no selection Relief from Meds: 10  Mobility walk without assistance how many minutes can you walk? 30 ability to climb steps?  yes do you drive?  no  Function I need assistance with the following:  meal prep  Neuro/Psych weakness tremor dizziness confusion depression anxiety suicidal thoughts- no active plans  Prior Studies Any changes since last visit?  no  Physicians involved in your care Any changes since last visit?  no   Family History  Problem Relation Age of Onset  . Hypertension Mother   . Hyperlipidemia Mother   . Hypertension Father   . Hyperlipidemia Father    Social History   Social History  . Marital status: Single    Spouse name: N/A  . Number of children: N/A  . Years of education: N/A   Social History Main Topics  . Smoking status: Never Smoker  . Smokeless tobacco: Never Used  . Alcohol use None  . Drug use: No  . Sexual activity: Not Asked   Other Topics Concern  . None   Social History  Narrative  . None   History reviewed. No pertinent surgical history. Past Medical History:  Diagnosis Date  . Asthma   . Depression    There were no vitals taken for this visit.  Opioid Risk Score:   Fall Risk Score:  `1  Depression screen PHQ 2/9  Depression screen PHQ 2/9 04/13/2016  Decreased Interest 3  Down, Depressed, Hopeless 3  PHQ - 2 Score 6  Altered sleeping 3  Tired, decreased energy 3  Change in appetite 1  Feeling bad or failure about yourself  3  Trouble concentrating 3  Moving slowly or fidgety/restless 3  Suicidal thoughts 2  PHQ-9 Score 24    Review of Systems  HENT: Negative.   Eyes: Negative.   Respiratory: Negative.   Cardiovascular: Negative.   Endocrine: Negative.   Genitourinary: Negative.   Musculoskeletal: Negative.   Skin: Negative.   Allergic/Immunologic: Negative.   Neurological: Positive for dizziness, tremors and weakness.  Hematological: Negative.   Psychiatric/Behavioral: Positive for confusion, dysphoric mood and suicidal ideas. The patient is nervous/anxious.   All other systems reviewed and are negative.      Objective:   Physical Exam  General: No acute distress ENT: Right ear appears normal external canal looks clean , no dried blood or other fluid noted, no stain on pillowcase Mood and affect are appropriate  Heart: Regular rate and rhythm no rubs murmurs or extra sounds Lungs: Clear to auscultation, breathing unlabored, no rales or wheezes Abdomen: Positive bowel sounds, soft nontender to palpation, nondistended Extremities: No clubbing, cyanosis, or edema Skin: small scrape on dorsum of right foot/ mild associated swelling Neurologic: Cranial nerves II through XII intact except for hearing right ear 50% less than left. motor strength is 5/5 in bilateral deltoid, bicep, tricep, grip, hip flexor, knee extensors, ankle dorsiflexor and plantar flexor Sensory exam normal sensation to light touch and proprioception in  bilateral upper and lower extremities Cerebellar exam normal finger to nose to finger as well as heel to shin in bilateral upper and lower extremities emotionallyt much better. Still distracted but redirectable. Less verbose and tangential. Less emotionally labile.  Musculoskeletal: Full range of motion in all 4 extremities. No joint swelling  Medical Problem List and Plan: 1. 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 Pain Management: continued 3. Mood: emotional lability--improved -continue psychosocial support, family ed -maintain klonopin at 1mg  tid           -continue prozac  60mg  daily           -maintain seroquel and vpa at current dose           -family remains supportive and appropriate  -reviewed stress mgt strategies 4. Sleep-             -discussed normal sleep-wake cycle 5. Hearing---local ear care  -ENT follow up 6. ?recent seizure---anxiety driven   Follow up in 2 months. Forty-five minutes of face to face patient care time were spent during this visit. All questions were encouraged and answered.

## 2016-05-10 ENCOUNTER — Telehealth: Payer: Self-pay | Admitting: Physical Medicine & Rehabilitation

## 2016-05-10 ENCOUNTER — Other Ambulatory Visit: Payer: Self-pay | Admitting: *Deleted

## 2016-05-10 NOTE — Telephone Encounter (Signed)
[facility] received a fax from our office with a prior authorization number for Seroquel on it and can't read it.  If someone could please call them at [phone].

## 2016-05-10 NOTE — Telephone Encounter (Signed)
Sent [facility] to [facility], quantity limit exception for quetiapine fumarate approved

## 2016-05-10 NOTE — Telephone Encounter (Signed)
Contacted pharmacy, confirmed PA number

## 2016-05-11 ENCOUNTER — Telehealth: Payer: Self-pay | Admitting: *Deleted
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
## 2016-05-14 ENCOUNTER — Telehealth: Payer: Self-pay | Admitting: Physical Medicine & Rehabilitation

## 2016-05-14 DIAGNOSIS — S069X3S Unspecified intracranial injury with loss of consciousness of 1 hour to 5 hours 59 minutes, sequela: Secondary | ICD-10-CM

## 2016-05-14 DIAGNOSIS — S069X9S Unspecified intracranial injury with loss of consciousness of unspecified duration, sequela: Secondary | ICD-10-CM

## 2016-05-14 DIAGNOSIS — F0281 Dementia in other diseases classified elsewhere with behavioral disturbance: Principal | ICD-10-CM

## 2016-05-14 DIAGNOSIS — F418 Other specified anxiety disorders: Secondary | ICD-10-CM

## 2016-05-14 DIAGNOSIS — F02B18 Dementia in other diseases classified elsewhere, moderate, with other behavioral disturbance: Secondary | ICD-10-CM

## 2016-05-14 MED ORDER — QUETIAPINE FUMARATE 50 MG PO TABS: ORAL_TABLET | ORAL | 3 refills | Status: AC
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
## 2016-05-14 NOTE — Telephone Encounter (Signed)
I spoke with Uncle. Discussed situation. Increased seroquel to 50mg  TID. Will also make referral for psychological counseling. Barbara CowerJason, can you help make sure we get her [facility]?   thanks

## 2016-05-17 ENCOUNTER — Telehealth: Payer: Self-pay | Admitting: Physical Medicine & Rehabilitation

## 2016-05-17 ENCOUNTER — Emergency Department (HOSPITAL_COMMUNITY)
Admission: EM | Admit: 2016-05-17 | Discharge: 2016-05-17 | Disposition: A | Discharge status: Home / Self Care | Payer: Medicaid Other | Attending: Emergency Medicine | Admitting: Emergency Medicine

## 2016-05-17 ENCOUNTER — Encounter (HOSPITAL_COMMUNITY): Payer: Self-pay | Admitting: Emergency Medicine

## 2016-05-17 DIAGNOSIS — F02B18 Dementia in other diseases classified elsewhere, moderate, with other behavioral disturbance: Secondary | ICD-10-CM

## 2016-05-17 DIAGNOSIS — F4325 Adjustment disorder with mixed disturbance of emotions and conduct: Secondary | ICD-10-CM | POA: Diagnosis not present

## 2016-05-17 DIAGNOSIS — J45909 Unspecified asthma, uncomplicated: Secondary | ICD-10-CM | POA: Diagnosis not present

## 2016-05-17 DIAGNOSIS — F329 Major depressive disorder, single episode, unspecified: Secondary | ICD-10-CM

## 2016-05-17 DIAGNOSIS — Z7982 Long term (current) use of aspirin: Secondary | ICD-10-CM | POA: Diagnosis not present

## 2016-05-17 DIAGNOSIS — S069X9S Unspecified intracranial injury with loss of consciousness of unspecified duration, sequela: Secondary | ICD-10-CM

## 2016-05-17 DIAGNOSIS — F0281 Dementia in other diseases classified elsewhere with behavioral disturbance: Principal | ICD-10-CM

## 2016-05-17 DIAGNOSIS — F32A Depression, unspecified: Secondary | ICD-10-CM

## 2016-05-17 DIAGNOSIS — Z79899 Other long term (current) drug therapy: Secondary | ICD-10-CM | POA: Insufficient documentation

## 2016-05-17 DIAGNOSIS — S069X3S Unspecified intracranial injury with loss of consciousness of 1 hour to 5 hours 59 minutes, sequela: Secondary | ICD-10-CM

## 2016-05-17 DIAGNOSIS — F418 Other specified anxiety disorders: Secondary | ICD-10-CM | POA: Diagnosis present

## 2016-05-17 LAB — COMPREHENSIVE METABOLIC PANEL
ALT: 14 U/L (ref 14–54)
ANION GAP: 8 (ref 5–15)
AST: 21 U/L (ref 15–41)
Albumin: 4.4 g/dL (ref 3.5–5.0)
Alkaline Phosphatase: 64 U/L (ref 38–126)
BILIRUBIN TOTAL: 0.6 mg/dL (ref 0.3–1.2)
BUN: 16 mg/dL (ref 6–20)
CO2: 26 mmol/L (ref 22–32)
Calcium: 9.2 mg/dL (ref 8.9–10.3)
Chloride: 105 mmol/L (ref 101–111)
Creatinine, Ser: 0.67 mg/dL (ref 0.44–1.00)
Glucose, Bld: 94 mg/dL (ref 65–99)
POTASSIUM: 4 mmol/L (ref 3.5–5.1)
Sodium: 139 mmol/L (ref 135–145)
TOTAL PROTEIN: 7.1 g/dL (ref 6.5–8.1)

## 2016-05-17 LAB — ACETAMINOPHEN LEVEL: Acetaminophen (Tylenol), Serum: <10 ug/mL — ABNORMAL LOW (ref 10–30)

## 2016-05-17 LAB — RAPID URINE DRUG SCREEN, HOSP PERFORMED
AMPHETAMINES: NOT DETECTED
BENZODIAZEPINES: POSITIVE — AB
Barbiturates: NOT DETECTED
Cocaine: NOT DETECTED
OPIATES: NOT DETECTED
Tetrahydrocannabinol: NOT DETECTED

## 2016-05-17 LAB — CBC
HCT: 39.3 % (ref 36.0–46.0)
Hemoglobin: 13.2 g/dL (ref 12.0–15.0)
MCH: 28.1 pg (ref 26.0–34.0)
MCHC: 33.6 g/dL (ref 30.0–36.0)
MCV: 83.8 fL (ref 78.0–100.0)
PLATELETS: 200 10*3/uL (ref 150–400)
RBC: 4.69 MIL/uL (ref 3.87–5.11)
RDW: 13.2 % (ref 11.5–15.5)
WBC: 6.4 10*3/uL (ref 4.0–10.5)

## 2016-05-17 LAB — POC URINE PREG, ED: Preg Test, Ur: NEGATIVE

## 2016-05-17 LAB — SALICYLATE LEVEL

## 2016-05-17 LAB — ETHANOL

## 2016-05-17 MED ORDER — ONDANSETRON HCL 4 MG PO TABS: 4.0000 mg | ORAL_TABLET | Freq: Three times a day (TID) | ORAL | Status: DC | PRN

## 2016-05-17 MED ORDER — FLUOXETINE HCL 20 MG PO CAPS
60.0000 mg | ORAL_CAPSULE | Freq: Every day | ORAL | Status: DC
  Administered 2016-05-17: 60 mg via ORAL
  Filled 2016-05-17: qty 3

## 2016-05-17 MED ORDER — LORAZEPAM 0.5 MG PO TABS: 0.5000 mg | ORAL_TABLET | Freq: Two times a day (BID) | ORAL | Status: DC | PRN

## 2016-05-17 MED ORDER — TRAMADOL HCL 50 MG PO TABS
50.0000 mg | ORAL_TABLET | Freq: Two times a day (BID) | ORAL | Status: DC
Start: 2016-05-17 — End: 2016-05-17
  Administered 2016-05-17: 50 mg via ORAL
  Filled 2016-05-17: qty 1

## 2016-05-17 MED ORDER — QUETIAPINE FUMARATE 50 MG PO TABS
50.0000 mg | ORAL_TABLET | Freq: Three times a day (TID) | ORAL | Status: DC
  Administered 2016-05-17: 50 mg via ORAL
  Filled 2016-05-17: qty 1

## 2016-05-17 MED ORDER — ACETAMINOPHEN 325 MG PO TABS: 650.0000 mg | ORAL_TABLET | ORAL | Status: DC | PRN

## 2016-05-17 MED ORDER — DIVALPROEX SODIUM 500 MG PO DR TAB
750.0000 mg | DELAYED_RELEASE_TABLET | Freq: Two times a day (BID) | ORAL | Status: DC
  Administered 2016-05-17: 750 mg via ORAL
  Filled 2016-05-17: qty 1

## 2016-05-17 MED ORDER — BACITRACIN ZINC 500 UNIT/GM EX OINT
TOPICAL_OINTMENT | CUTANEOUS | Status: AC
  Filled 2016-05-17: qty 0.9

## 2016-05-17 MED ORDER — IBUPROFEN 200 MG PO TABS
600.0000 mg | ORAL_TABLET | Freq: Three times a day (TID) | ORAL | Status: DC | PRN
  Administered 2016-05-17: 600 mg via ORAL
  Filled 2016-05-17: qty 3

## 2016-05-17 MED ORDER — ASPIRIN 325 MG PO TABS
325.0000 mg | ORAL_TABLET | Freq: Every day | ORAL | Status: DC
  Administered 2016-05-17: 325 mg via ORAL
  Filled 2016-05-17: qty 1

## 2016-05-17 MED ORDER — TRAMADOL HCL 50 MG PO TABS: 50.0000 mg | ORAL_TABLET | Freq: Two times a day (BID) | ORAL | Status: DC | PRN

## 2016-05-17 MED ORDER — ALUM & MAG HYDROXIDE-SIMETH 200-200-20 MG/5ML PO SUSP: 30.0000 mL | ORAL | Status: DC | PRN

## 2016-05-17 MED ORDER — CLONAZEPAM 0.5 MG PO TABS: 1.0000 mg | ORAL_TABLET | Freq: Three times a day (TID) | ORAL | Status: DC

## 2016-05-17 NOTE — Telephone Encounter (Signed)
Patient is needing Dr. [name] to call her.  [facility] [address] [facility] and the number there is [phone].  She will be there until 1 pm today.  If you can't reach her there, please call her cell phone number [phone].
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
## 2016-05-17 NOTE — BH Assessment (Signed)
[facility] Assessment Progress Note  Per Thedore MinsMojeed Akintayo, MD, this pt does not require psychiatric hospitalization at this time.  [facility].  This has been included in pt's discharge instructions.  Pt's nurse, Marylu LundJanet, has been notified.  Doylene Canninghomas Shawndell Schillaci, MA Triage Specialist [phone]

## 2016-05-17 NOTE — Discharge Instructions (Addendum)
For your ongoing behavioral health needs you are advised to follow up with [facility].  New patients are seen at their walk-in clinic.  Walk-in hours are Monday - Friday from 8:00 am - 12:00 pm, and from 1:00 pm - 3:00 pm.  Walk-in patients are seen on a first come, first served basis, so try to arrive as early as possible for the best chance of being seen the same day.  There is an initial fee of $22.50: ° °     [facility] °     [address] °     [place], [place] [address] °     [phone] °
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
## 2016-05-17 NOTE — ED Notes (Signed)
TTS in with pt. 

## 2016-05-17 NOTE — ED Notes (Signed)
Bed: WTR5 Expected date:  Expected time:  Means of arrival:  Comments: 

## 2016-05-17 NOTE — Progress Notes (Signed)
Patient was given AVS and verbalized understanding of discharge instructions. Patient is stable at discharge.

## 2016-05-18 NOTE — Telephone Encounter (Signed)
Spoke with Uncle. Have ordered outpt SLP and PT at [facility]. Discussed behaviors. Ultimately she will go home to [place], perhaps some time in November. Will not make medication changes today. If there is another episode where behavior becomes uncontrollable and she appears to be a threat to harm herself, they should bring her back to the ED. Preferably, they will continue working on behavioral and environmental strategies to avoid escalations.

## 2016-05-18 NOTE — Addendum Note (Signed)
Addended by: Faith RogueSWARTZ, Gregery Walberg T on: 05/18/2016 11:59 AM   Modules accepted: Orders

## 2016-05-25 ENCOUNTER — Telehealth: Payer: Self-pay

## 2016-05-28 ENCOUNTER — Encounter (HOSPITAL_COMMUNITY): Payer: Self-pay

## 2016-05-28 ENCOUNTER — Emergency Department (HOSPITAL_COMMUNITY)
Admission: EM | Admit: 2016-05-28 | Discharge: 2016-05-29 | Disposition: A | Discharge status: Home / Self Care | Payer: Medicaid Other | Attending: Dermatology | Admitting: Dermatology

## 2016-05-28 ENCOUNTER — Telehealth: Payer: Self-pay | Admitting: Physical Medicine & Rehabilitation

## 2016-05-28 DIAGNOSIS — J45909 Unspecified asthma, uncomplicated: Secondary | ICD-10-CM | POA: Diagnosis not present

## 2016-05-28 DIAGNOSIS — Z79899 Other long term (current) drug therapy: Secondary | ICD-10-CM | POA: Diagnosis not present

## 2016-05-28 DIAGNOSIS — R51 Headache: Secondary | ICD-10-CM | POA: Diagnosis not present

## 2016-05-28 DIAGNOSIS — Z5321 Procedure and treatment not carried out due to patient leaving prior to being seen by health care provider: Secondary | ICD-10-CM | POA: Diagnosis not present

## 2016-05-28 DIAGNOSIS — Z7982 Long term (current) use of aspirin: Secondary | ICD-10-CM | POA: Diagnosis not present

## 2016-05-28 NOTE — Telephone Encounter (Signed)
Patient would like to know if it will be okay to get a tattoo when she goes out of town.

## 2016-05-28 NOTE — Telephone Encounter (Signed)
Dr [name] said it is up to her and ok.
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
## 2016-05-28 NOTE — ED Notes (Signed)
Called Pt for room. No response. Checked in triage and with nurses. No Response.

## 2016-05-31 ENCOUNTER — Ambulatory Visit: Payer: Medicaid Other | Admitting: Physical Therapy

## 2016-05-31 ENCOUNTER — Ambulatory Visit: Payer: Medicaid Other | Admitting: Speech Pathology

## 2016-06-01 ENCOUNTER — Ambulatory Visit (HOSPITAL_COMMUNITY): Admission: RE | Admit: 2016-06-01 | Payer: Medicaid Other | Source: Ambulatory Visit

## 2016-06-11 ENCOUNTER — Encounter: Payer: Medicaid Other | Admitting: Obstetrics and Gynecology

## 2016-07-11 ENCOUNTER — Encounter: Payer: Self-pay | Attending: Physical Medicine & Rehabilitation | Admitting: Physical Medicine & Rehabilitation

## 2016-07-11 DIAGNOSIS — Z79899 Other long term (current) drug therapy: Secondary | ICD-10-CM | POA: Insufficient documentation

## 2016-07-11 DIAGNOSIS — Z87820 Personal history of traumatic brain injury: Secondary | ICD-10-CM | POA: Insufficient documentation

## 2016-07-11 DIAGNOSIS — F09 Unspecified mental disorder due to known physiological condition: Secondary | ICD-10-CM | POA: Insufficient documentation

## 2016-08-13 NOTE — Telephone Encounter (Signed)
Error

## 2017-01-30 IMAGING — CR DG CHEST 1V PORT
1 series · 1 of 1 positions shown · non-contrast
Comparison: March 03, 2016

CLINICAL DATA: Respiratory failure.

EXAM:
PORTABLE CHEST 1 VIEW

[AP]
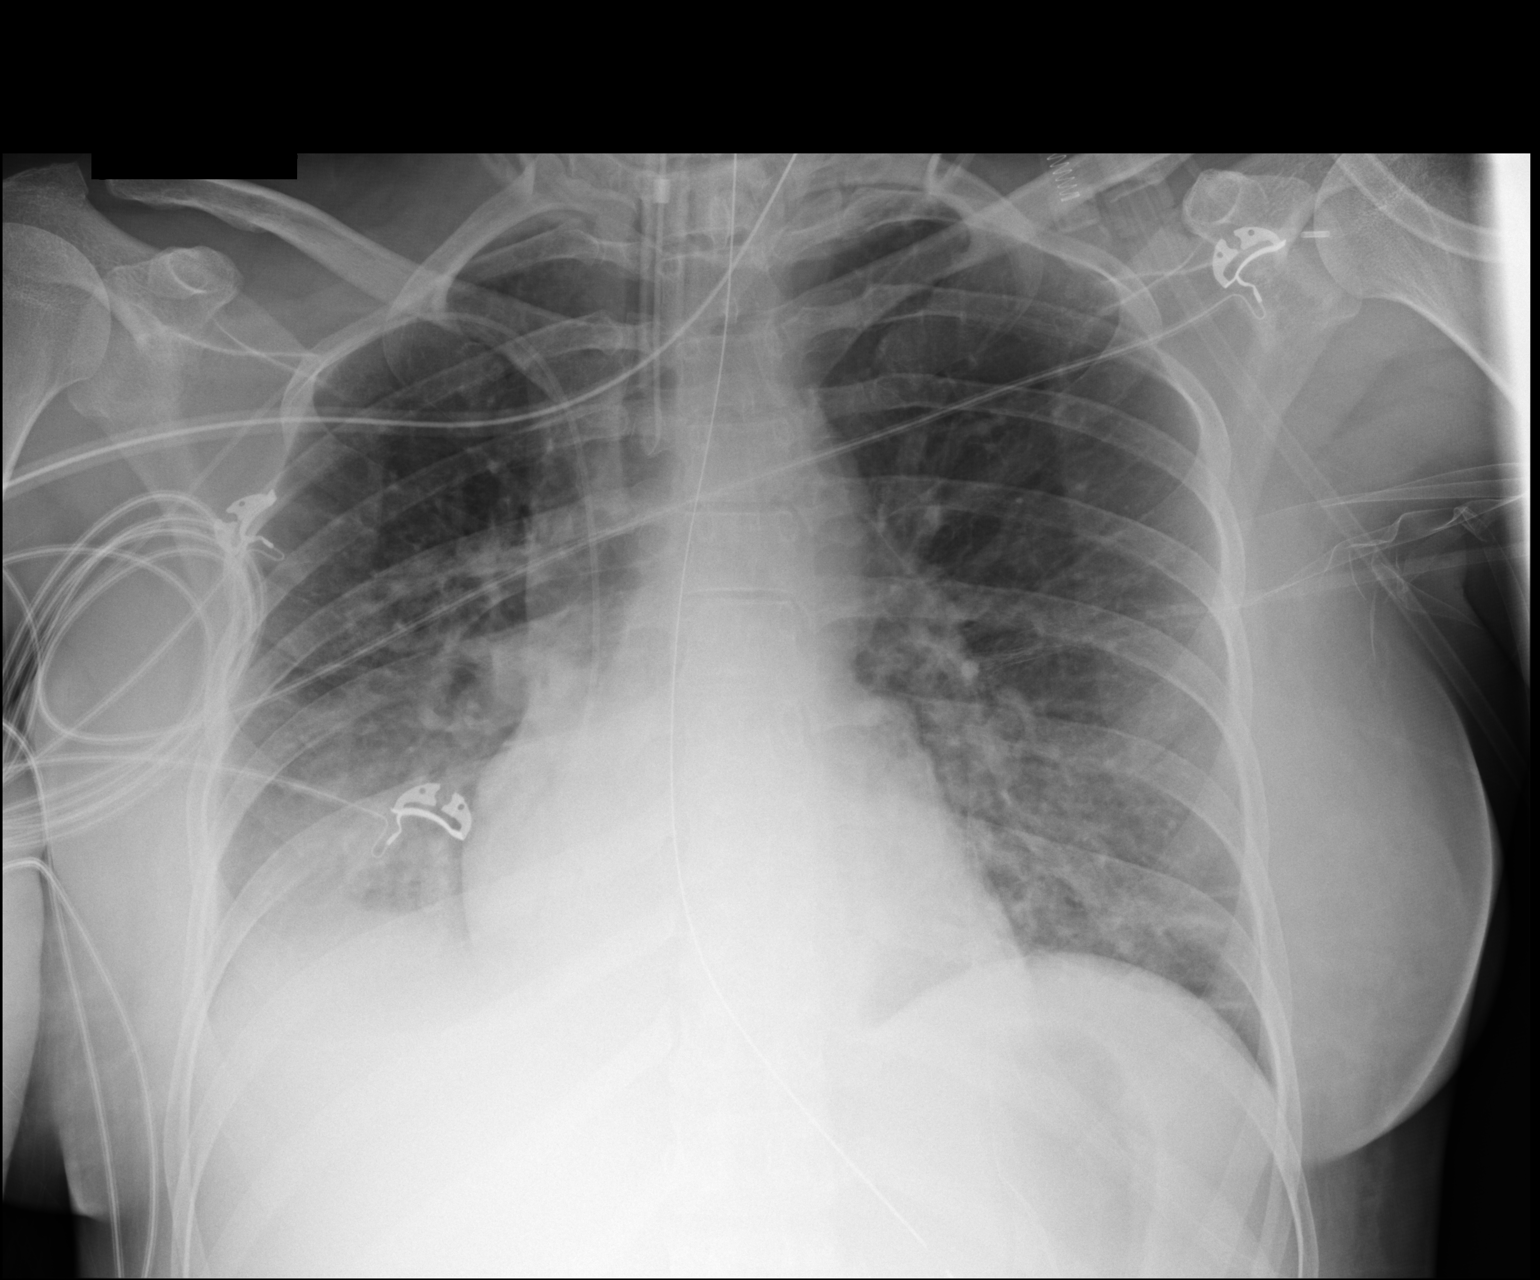

[1 of 1 positions shown; findings below may reference images not displayed]

FINDINGS: Stable support apparatus. Opacity in the right lung base is stable
to mildly worsened. There is mild opacity in the left base, also
more prominent the interval. No other changes. Recommend follow-up
to resolution.
IMPRESSION: Stable support apparatus. Worsening opacity in the bases, right
greater than left. Recommend follow-up to resolution.
